# Patient Record
Sex: Female | Born: 1945 | Race: Black or African American | Hispanic: No | Smoking: Never smoker
Health system: Southern US, Community
[De-identification: ages and names within clinical notes are randomized; demographics above are authoritative.]

## PROBLEM LIST (undated history)

## (undated) DIAGNOSIS — F329 Major depressive disorder, single episode, unspecified: Secondary | ICD-10-CM

## (undated) DIAGNOSIS — F419 Anxiety disorder, unspecified: Secondary | ICD-10-CM

## (undated) DIAGNOSIS — K921 Melena: Secondary | ICD-10-CM

## (undated) DIAGNOSIS — M1990 Unspecified osteoarthritis, unspecified site: Secondary | ICD-10-CM

## (undated) DIAGNOSIS — N390 Urinary tract infection, site not specified: Secondary | ICD-10-CM

## (undated) DIAGNOSIS — K219 Gastro-esophageal reflux disease without esophagitis: Secondary | ICD-10-CM

## (undated) DIAGNOSIS — F32A Depression, unspecified: Secondary | ICD-10-CM

## (undated) DIAGNOSIS — E079 Disorder of thyroid, unspecified: Secondary | ICD-10-CM

## (undated) DIAGNOSIS — I10 Essential (primary) hypertension: Secondary | ICD-10-CM

## (undated) DIAGNOSIS — G4730 Sleep apnea, unspecified: Secondary | ICD-10-CM

## (undated) HISTORY — DX: Urinary tract infection, site not specified: N39.0

## (undated) HISTORY — DX: Depression, unspecified: F32.A

## (undated) HISTORY — PX: HERNIA REPAIR: SHX51

## (undated) HISTORY — DX: Major depressive disorder, single episode, unspecified: F32.9

## (undated) HISTORY — DX: Gastro-esophageal reflux disease without esophagitis: K21.9

## (undated) HISTORY — DX: Melena: K92.1

## (undated) HISTORY — DX: Unspecified osteoarthritis, unspecified site: M19.90

## (undated) HISTORY — DX: Sleep apnea, unspecified: G47.30

## (undated) HISTORY — DX: Essential (primary) hypertension: I10

## (undated) HISTORY — DX: Anxiety disorder, unspecified: F41.9

## (undated) HISTORY — DX: Disorder of thyroid, unspecified: E07.9

---

## 1998-08-21 ENCOUNTER — Ambulatory Visit (HOSPITAL_COMMUNITY): Admission: RE | Admit: 1998-08-21 | Discharge: 1998-08-21 | Payer: Self-pay | Admitting: General Surgery

## 1998-09-03 ENCOUNTER — Ambulatory Visit (HOSPITAL_COMMUNITY): Admission: RE | Admit: 1998-09-03 | Discharge: 1998-09-03 | Payer: Self-pay | Admitting: General Surgery

## 2005-01-17 ENCOUNTER — Emergency Department (HOSPITAL_COMMUNITY): Admission: EM | Admit: 2005-01-17 | Discharge: 2005-01-17 | Payer: Self-pay | Admitting: Family Medicine

## 2005-07-04 ENCOUNTER — Ambulatory Visit: Payer: Self-pay | Admitting: Sports Medicine

## 2005-07-04 ENCOUNTER — Inpatient Hospital Stay (HOSPITAL_COMMUNITY): Admission: EM | Admit: 2005-07-04 | Discharge: 2005-07-06 | Payer: Self-pay | Admitting: Emergency Medicine

## 2005-07-05 ENCOUNTER — Encounter (INDEPENDENT_AMBULATORY_CARE_PROVIDER_SITE_OTHER): Payer: Self-pay | Admitting: Cardiology

## 2005-09-17 ENCOUNTER — Ambulatory Visit: Payer: Self-pay | Admitting: Family Medicine

## 2006-05-31 ENCOUNTER — Ambulatory Visit: Payer: Self-pay | Admitting: Sports Medicine

## 2007-02-16 DIAGNOSIS — K5900 Constipation, unspecified: Secondary | ICD-10-CM

## 2007-02-16 DIAGNOSIS — M109 Gout, unspecified: Secondary | ICD-10-CM

## 2007-02-16 DIAGNOSIS — K449 Diaphragmatic hernia without obstruction or gangrene: Secondary | ICD-10-CM | POA: Insufficient documentation

## 2008-05-23 ENCOUNTER — Ambulatory Visit (HOSPITAL_COMMUNITY): Admission: RE | Admit: 2008-05-23 | Discharge: 2008-05-23 | Payer: Self-pay | Admitting: Internal Medicine

## 2009-12-14 ENCOUNTER — Emergency Department (HOSPITAL_COMMUNITY): Admission: EM | Admit: 2009-12-14 | Discharge: 2009-12-14 | Payer: Self-pay | Admitting: Emergency Medicine

## 2010-03-23 ENCOUNTER — Encounter (INDEPENDENT_AMBULATORY_CARE_PROVIDER_SITE_OTHER): Payer: Self-pay | Admitting: *Deleted

## 2011-01-10 ENCOUNTER — Encounter: Payer: Self-pay | Admitting: Family Medicine

## 2011-01-19 ENCOUNTER — Emergency Department (HOSPITAL_COMMUNITY)
Admission: EM | Admit: 2011-01-19 | Discharge: 2011-01-19 | Payer: Self-pay | Source: Home / Self Care | Admitting: Emergency Medicine

## 2011-01-19 LAB — DIFFERENTIAL
Basophils Absolute: 0 10*3/uL (ref 0.0–0.1)
Basophils Relative: 0 % (ref 0–1)
Eosinophils Absolute: 0.1 10*3/uL (ref 0.0–0.7)
Eosinophils Relative: 1 % (ref 0–5)
Lymphocytes Relative: 14 % (ref 12–46)
Lymphs Abs: 1.5 K/uL (ref 0.7–4.0)
Monocytes Absolute: 0.7 10*3/uL (ref 0.1–1.0)
Monocytes Relative: 6 % (ref 3–12)
Neutro Abs: 8.3 10*3/uL — ABNORMAL HIGH (ref 1.7–7.7)
Neutrophils Relative %: 79 % — ABNORMAL HIGH (ref 43–77)

## 2011-01-19 LAB — CBC
HCT: 41.8 % (ref 36.0–46.0)
Hemoglobin: 13.8 g/dL (ref 12.0–15.0)
MCH: 27.7 pg (ref 26.0–34.0)
MCHC: 33 g/dL (ref 30.0–36.0)
MCV: 83.8 fL (ref 78.0–100.0)
Platelets: 350 10*3/uL (ref 150–400)
RBC: 4.99 MIL/uL (ref 3.87–5.11)
RDW: 15.1 % (ref 11.5–15.5)
WBC: 10.5 K/uL (ref 4.0–10.5)

## 2011-01-19 LAB — BASIC METABOLIC PANEL
BUN: 8 mg/dL (ref 6–23)
CO2: 29 mEq/L (ref 19–32)
Calcium: 9.5 mg/dL (ref 8.4–10.5)
Creatinine, Ser: 0.95 mg/dL (ref 0.4–1.2)
GFR calc Af Amer: >60 mL/min (ref >60)
Glucose, Bld: 96 mg/dL (ref 70–99)

## 2011-01-19 LAB — BRAIN NATRIURETIC PEPTIDE: Pro B Natriuretic peptide (BNP): <30 pg/mL (ref 0.0–100.0)

## 2011-01-19 LAB — BASIC METABOLIC PANEL WITH GFR
Chloride: 104 meq/L (ref 96–112)
GFR calc non Af Amer: 60 mL/min — ABNORMAL LOW (ref >60)
Potassium: 3.8 meq/L (ref 3.5–5.1)
Sodium: 143 meq/L (ref 135–145)

## 2011-01-19 LAB — POCT CARDIAC MARKERS: Myoglobin, poc: 114 ng/mL (ref 12–200)

## 2011-01-19 NOTE — Letter (Signed)
Summary: New Patient letter  [facility]  [address] [place], [place] [address]   Phone: [phone]  Fax: [phone]       03/23/2010 MRN: [id number]  [facility] [address] [place], [place]  [address]  Dear Ms. Thelma Barge,  Welcome to the Gastroenterology Division at [facility].    You are scheduled to see Dr.  Jarold Motto on Apr 23, 2010 at 10am on the 3rd floor at [facility], [address].  We ask that you try to arrive at our office 15 minutes prior to your appointment time to allow for check-in.  We would like you to complete the enclosed self-administered evaluation form prior to your visit and bring it with you on the day of your appointment.  We will review it with you.  Also, please bring a complete list of all your medications or, if you prefer, bring the medication bottles and we will list them.  Please bring your insurance card so that we may make a copy of it.  If your insurance requires a referral to see a specialist, please bring your referral form from your primary care physician.  Co-payments are due at the time of your visit and may be paid by cash, check or credit card.     Your office visit will consist of a consult with your physician (includes a physical exam), any laboratory testing he/she may order, scheduling of any necessary diagnostic testing (e.g. x-ray, ultrasound, CT-scan), and scheduling of a procedure (e.g. Endoscopy, Colonoscopy) if required.  Please allow enough time on your schedule to allow for any/all of these possibilities.    If you cannot keep your appointment, please call [phone] to cancel or reschedule prior to your appointment date.  This allows [place] the opportunity to schedule an appointment for another patient in need of care.  If you do not cancel or reschedule by 5 p.m. the business day prior to your appointment date, you will be charged a $50.00 late cancellation/no-show fee.    Thank you for  choosing [facility] for your medical needs.  We appreciate the opportunity to care for you.  Please visit [place] at our website  to learn more about our practice.                     Sincerely,                                                             The Gastroenterology Division

## 2011-03-22 LAB — POCT URINALYSIS DIP (DEVICE)
Bilirubin Urine: NEGATIVE
Glucose, UA: NEGATIVE mg/dL
Hgb urine dipstick: NEGATIVE
Ketones, ur: NEGATIVE mg/dL
Nitrite: NEGATIVE
Protein, ur: 30 mg/dL — AB
Specific Gravity, Urine: 1.01 (ref 1.005–1.030)
Urobilinogen, UA: 0.2 mg/dL (ref 0.0–1.0)
pH: 6 (ref 5.0–8.0)

## 2011-05-07 NOTE — H&P (Signed)
NAME:  Frances [name], Frances [name]              ACCOUNT NO.:  [id number]   MEDICAL RECORD NO.:  [id number]          PATIENT TYPE:  INP   LOCATION:  5524                         FACILITY:  [facility]   PHYSICIAN:  Wayne A. Sheffield Slider, M.D.    DATE OF BIRTH:  October 11, 1946   DATE OF ADMISSION:  07/04/2005  DATE OF DISCHARGE:                                HISTORY & PHYSICAL   CHIEF COMPLAINT:  Abdominal pain/arrhythmia.   HISTORY OF PRESENT ILLNESS:  This is a 65 year old obese African American  female with a one-week history of abdominal pain that increased this morning  causing her to go to her primary care physician at [facility].  While there,  they noticed an irregular heartbeat on physical examination and did an EKG  that showed bigeminy.  Her abdominal pain was a dully, achy pain on the left  side, just underneath the rib cage.  She had nausea but no vomiting. She has  had diarrhea for about one week.  She has had some abdominal pain for the  last week every since she started increasing her gout medications for acute  gout flare up.  Her medications include Indocin and colchicine.  She self-  discontinued these medications about three days ago.  She denies any chest  pain, any syncope or presyncopal events recently and any short of breath at  rest.  She has no diagnosis of heart failure or history of MI, no previous  echocardiogram.  She does have a history of hypertension.  She is able to  walk up a flight of stairs, though she does become short of breath, but she  feels it is secondary to her deconditioning and obesity.   REVIEW OF SYSTEMS:  NEUROLOGIC:  No weakness or numbness.  HEENT:  The  patient has a one to two day history of increasing sinus discharge and sore  throat.  She denies cough.  She denies any tooth pain.  PULMONARY:  Within  normal limits.  CARDIOVASCULAR:  See HPI.  GI:  The patient states that  about a year ago she had some bright red blood per rectum and was told by  her physician  that she had some external hemorrhoids and a rectal fissure.  It was recommended that she get a colonoscopy at that time, which she  decided not to follow through with.  She also has a history of occasional  difficulty swallowing with solids and they were going to do an upper  endoscopy at the same time, but like I said, she did not follow through with  her appointment.  GU:  No burning or pain during urination.  MUSCULOSKELETAL:  She has considerable arthritis and recent acute gout flare  up.   MEDICATIONS:  1.  Indomethacin discontinued two days ago.  2.  Synthroid 50 mcg daily.  3.  Colchicine 0.6 mg twice a day discontinued two days ago.  4.  Triamterene 37.5 mg/hydrochlorothiazide 25 mg.  5.  She received aspirin 324 mg in the emergency department.   PAST MEDICAL HISTORY:  1.  Gout.  2.  Hypothyroidism.  3.  Hypertension.  4.  History of umbilical hernia repair in 1999.  5.  History of palpations, no hospitalizations.   SOCIAL HISTORY:  She lives with her daughter.  Denies tobacco, alcohol or  other drug use but does say that she uses a lot of caffeine.  She has  increased stress in her life secondary to her daughter's recent move.   FAMILY HISTORY:  Father died at age 69 of a CVA and had hypertension.  No  diabetes in the family.  Grandmother also died of a stroke.  Mother is still  alive and only has arthritis.  Siblings are all alive and well.   PHYSICAL EXAMINATION:  GENERAL APPEARANCE:  No acute distress. Obese African  American female.  PSYCHIATRIC:  Patient has good mood, affect is normal, alert and oriented  x3.  VITAL SIGNS:  Temperature 97.1, pulse 79 to 80, respiratory rate 18 to 22,  blood pressure 114 to 138/29 to 70.  Her blood pressure is rechecked after  that small diastolic blood pressure and it was 135/38.  HEENT:  Moist mucous membranes.  Increased nasal discharge, light yellow.  Tympanic membranes clear.  No thyromegaly.  LYMPHATICS:  There are no  palpable lymph nodes in the cervical, clavicular  and axillary regions.  LUNGS:  Clear to auscultation bilaterally.  CARDIOVASCULAR:  Irregular rhythm but regular rate.  No murmurs, rubs, or  gallops.  ABDOMEN:  Soft, nontender, normal active bowel sounds.  No  hepatosplenomegaly, although the exam was limited by patient's obesity.  BACK:  No CVA tenderness.  RECTAL:  No masses.  Soft stool palpated.  External hemorrhoid noted.  No  active bleeding.  EXTREMITIES:  No edema or cyanosis.  SKIN:  No rashes.  MUSCULOSKELETAL:  Good range of motion and strength 5/5 throughout.  NEUROLOGIC:  Cranial nerves II-XII  intact.  Gross motor and sensation  intact.  DTRs 2+ throughout.   LABORATORY DATA:  Fecal occult blood negative.  White blood cells 11.7,  hemoglobin 13.4, hematocrit 40.3, platelets 426, ANC 9.8.  UA normal.  Lipase 21, total protein 7.2, albumin 3.5, AST 26, ALT 27, alkaline  phosphatase 80, total bilirubin 0.6, direct bilirubin 0.1, indirect 0.5.  ISTAT-8:  Sodium 141, potassium 3.6, chloride 104, bicarb 31.9, BUN 14,  creatinine 0.9, glucose 96.  Myoglobin 119, CK-MB less than 1.0, troponin I  less than 0.05.  BNP less than 30.  Cardiac enzymes negative x3.  Tox-screen  was negative.   EKG showing multiple PVCs/bigeminy.   Abdominal x-ray and chest showed no acute cardiopulmonary processes and  nonspecific bowel gas pattern.   ASSESSMENT/PLAN:  A 65 year old [place] American female.  1.  PVCs.  We will continue to monitor on telemetry.  We will rule out      myocardial infarction as PVCs carry a higher morbidity and mortality      rate with an acute myocardial infarction.  Early cardiac enzymes were      negative and there were no signs of ischemia on EKG; however, we will      continue to monitor and recheck enzymes.  We will also order an echo in      the morning to rule out mitral valve prolapse which also has a     significant morbidity and mortality when accompanied  with PVCs.  We will      consider a cardiology consult in the morning and will get a repeat EKG      in the morning.  2.  Abdominal pain/diarrhea.  This has currently resolved and her      electrolytes look okay.  She has been eating well.  Her lipase is      normal.  As mentioned above, her abdominal films were normal and her      fecal occult blood was negative.  We will start Protonix 40 mg daily      anyway.  3.  Hypothyroidism.  Will continue 50 mcg Synthroid, will check TSH.  4.  Gout.  We are going to hold Indomethacin and colchicine.  Will also hold      hydrochlorothiazide as this medication is known to help precipitate      gout.  We will write for Tylenol p.r.n. for pain.  5.  Hypertension.  For now, we will hold both her home medications,      triamterene  37.5 and hydrochlorothiazide 25, because both have adverse      effects on the potassium.  6.  Borderline hypokalemia.  Although her potassium is within normal range      on the ISTAT-8, she is at risk for hypokalemia and we will replace with      40 mEq K-Dur right now.  We will check a more accurate traditional BMP      in follow-up.  7.  Upper respiratory tract infection.  This is most likely viral.  We will      watch.  8.  Fluids, electrolytes, and nutrition. Patient is eating as stated above.      We are repleting borderline potassium.  The patient has an IV and we      will Hep-Lock it for now.  9.  Disposition:  The patient will most likely be discharged tomorrow or the      next day depending upon how long it takes to get results of echo and      other tests.      Clifton Custard   AL/MEDQ  D:  07/04/2005  T:  07/04/2005  Job:  [id number]   cc:   Carleene Cooper III, M.D.  [address]  [place]  [place] [address]  Fax: [phone]

## 2011-05-07 NOTE — Discharge Summary (Signed)
NAME:  Frances [name], Frances [name]              ACCOUNT NO.:  [id number]   MEDICAL RECORD NO.:  [id number]          PATIENT TYPE:  INP   LOCATION:  5532                         FACILITY:  [facility]   PHYSICIAN:  Melina Fiddler, MD DATE OF BIRTH:  09-24-1946   DATE OF ADMISSION:  07/04/2005  DATE OF DISCHARGE:  07/06/2005                                 DISCHARGE SUMMARY   PREOPERATIVE DIAGNOSES:  1.  Premature ventricular contractions/bigeminy.  2.  Hypertension.  3.  Gout.  4.  Hypothyroidism.   PROCEDURES:  1.  Echocardiogram on July 05, 2005, showed left ventricular hypertrophy,      some mild mitral regurgitation, but a normal ejection fraction between      50 and 60%.  2.  EKGs on July 04, 2005, and July 05, 2005, showing normal sinus rhythm      with frequent PVCs/bigeminy.  3.  Abdomen two-view and chest that showed no acute cardiopulmonary      processes and no acute abdominal processes, nonspecific bowel gas      pattern.   DISCHARGE MEDICATIONS:  1.  Toprol XL 50 mg.  2.  Protonix 40 mg daily.  3.  Synthroid 50 mcg daily.   HOSPITAL COURSE:  The patient was admitted on July 04, 2005, after being  transferred from her urgent care facility.  She had had abdominal complaints  including diarrhea and abdominal pain for the last week or so, which  worsened in the day before admission.  In the urgent care center, the  patient was found to have bigeminy and was transferred to the hospital for a  workup.  In the hospital, she was ruled out for MI with three normal cardiac  enzyme sets.  We checked her potassium, which was on the low side of normal,  so she was given a total of 80 mEq of K-Dur, and she got an echocardiogram  that showed no mitral prolapse, left ventricular hypertrophy, some mild  mitral regurgitation, and a normal ejection fraction.  Cardiology was  consulted and they decided the patient most likely had obstructive sleep  apnea, but they scheduled an outpatient stress  test to be sure she had no  coronary artery disease.  We checked her uric acid, which was 10, and her  TSH, which was within the normal range.  Since the patient was on a  combination drug with HCTZ on admission, triamterene/HCTZ, we discontinued  that medication as it has a tendency to worsen gout.   Admission labs:  Total bilirubin 0.6, direct bilirubin 0.1, indirect 0.5,  alkaline phosphatase 80, AST 26, ALT 27, total protein 7.2, albumin 3.5.  Cardiac enzymes negative x3.  UA was normal.  Urine drug screen was  negative.  Fecal occult blood was negative.  Sodium 141, potassium 3.6,  chloride 104, glucose 96, BUN 14, bicarb 31.9.  Hemoglobin 15.0, hematocrit  44.0.   Problem 1.  PVC/BIGEMINY:  Since we ruled the patient out for a mitral valve  prolapse, hypokalemia and acute MI, it was determined that the PVCs were  most likely not a danger to the  patient's health, which was confirmed by  cardiology.  Cardiology also decided they wanted to do a test for a sleep  study and a stress test.  I have arranged for both of those to be done in  the outpatient setting.  We switched her hypertension medication from the  triamterene/HCTZ to Toprol XL.  Most likely the patient's PVCs should not  have an impact on her life.  At the time of discharge, the frequency of the  PVCs had decreased significantly from about one every six to nine to  extremely infrequently.   Problem 2.  HYPERTENSION:  As stated in #1, the patient was switched from  triamterine/HCTZ over to Toprol XL 50 mg daily at night.   Problem 3.  HYPOTHYROID:  The patient's TSH was normal, so she was continued  on Synthroid 50 mcg daily.   Problem 4.  GOUT:  The patient was advised to continue her home medications  during an acute flare-up although the patient was currently not having a  symptomatic flare.  These include colchicine.  She was also recommended to  switch from indomethacin as a supplement to the colchicine to a more  mild  NSAID like Naprosyn or Aleve.   Problem 5.  GASTROESOPHAGEAL REFLUX DISEASE:  The patient was started on  Protonix 40 mg daily because of her abdominal pain on admission.  It was  determined that there is a good chance that this is related to  gastroesophageal reflux disease, and the patient will continue on Protonix  40 mg daily to prevent further intense abdominal pain episodes.   DISCHARGE LABORATORY DATA:  Sodium 143, potassium 3.9, chloride 106, CO2 29,  glucose 104, BUN 12, creatinine 1.2, calcium 9.4.  Cholesterol 156,  triglycerides 131, HDL 34, LDL 96, VLDL 26.  TSH 1.659.  Uric acid 10.0.   FOLLOW-UP ITEMS:  1.  PVCs/bigeminy:  The patient has an appointment for follow-up with Dr.      Jenne Campus on August 4 at 2:45 p.m.  She also has an appointment for a      stress test tomorrow morning on July 07, 2005.  Cardiology has stated      that they will schedule her for a sleep study as well.  2.  Hypertension:  The patient will follow up with primary care for      hypertension.  3.  Hypothyroid and gout:  The patient will also follow those up with her      primary care physician, Dr. Irving Burton on August 06, 2005, at 1:30 p.m.      Clifton Custard   AL/MEDQ  D:  07/06/2005  T:  07/07/2005  Job:  [id number]   cc:   Angeline Slim, M.D.  Fax: [phone]   Darlin Priestly, MD  [phone] [address]  [place]  [place] [address]  Fax: [phone]

## 2011-05-10 ENCOUNTER — Other Ambulatory Visit: Payer: Self-pay | Admitting: Otolaryngology

## 2011-05-10 DIAGNOSIS — R42 Dizziness and giddiness: Secondary | ICD-10-CM

## 2011-05-14 ENCOUNTER — Ambulatory Visit
Admission: RE | Admit: 2011-05-14 | Discharge: 2011-05-14 | Disposition: A | Discharge status: Home / Self Care | Payer: BC Managed Care – PPO | Source: Ambulatory Visit | Attending: Otolaryngology | Admitting: Otolaryngology

## 2011-05-14 DIAGNOSIS — R42 Dizziness and giddiness: Secondary | ICD-10-CM

## 2013-05-02 ENCOUNTER — Other Ambulatory Visit: Payer: Self-pay | Admitting: Gastroenterology

## 2013-05-02 DIAGNOSIS — R1013 Epigastric pain: Secondary | ICD-10-CM

## 2013-05-07 ENCOUNTER — Other Ambulatory Visit: Payer: BC Managed Care – PPO

## 2015-04-22 ENCOUNTER — Telehealth: Payer: Self-pay | Admitting: Internal Medicine

## 2015-04-22 NOTE — Telephone Encounter (Signed)
Patient is requesting to establish care with Dr. Jones.  Please advise.   °

## 2015-04-23 NOTE — Telephone Encounter (Signed)
Got scheduled  °

## 2015-04-23 NOTE — Telephone Encounter (Signed)
ok 

## 2015-05-08 ENCOUNTER — Ambulatory Visit: Payer: Self-pay | Admitting: Internal Medicine

## 2015-05-12 ENCOUNTER — Ambulatory Visit: Payer: Self-pay | Admitting: Internal Medicine

## 2015-05-15 ENCOUNTER — Ambulatory Visit: Payer: Self-pay | Admitting: Internal Medicine

## 2015-05-30 ENCOUNTER — Ambulatory Visit: Payer: Self-pay | Admitting: Internal Medicine

## 2015-07-01 ENCOUNTER — Ambulatory Visit (INDEPENDENT_AMBULATORY_CARE_PROVIDER_SITE_OTHER): Payer: Medicare Other | Admitting: Internal Medicine

## 2015-07-01 ENCOUNTER — Encounter: Payer: Self-pay | Admitting: Internal Medicine

## 2015-07-01 VITALS — BP 140/88 | HR 74 | Temp 98.5°F | Resp 16 | Ht 63.0 in | Wt 236.1 lb

## 2015-07-01 DIAGNOSIS — H15109 Unspecified episcleritis, unspecified eye: Secondary | ICD-10-CM | POA: Insufficient documentation

## 2015-07-01 DIAGNOSIS — E559 Vitamin D deficiency, unspecified: Secondary | ICD-10-CM | POA: Insufficient documentation

## 2015-07-01 DIAGNOSIS — I10 Essential (primary) hypertension: Secondary | ICD-10-CM

## 2015-07-01 DIAGNOSIS — E039 Hypothyroidism, unspecified: Secondary | ICD-10-CM | POA: Diagnosis not present

## 2015-07-01 DIAGNOSIS — M109 Gout, unspecified: Secondary | ICD-10-CM | POA: Insufficient documentation

## 2015-07-01 DIAGNOSIS — K449 Diaphragmatic hernia without obstruction or gangrene: Secondary | ICD-10-CM | POA: Insufficient documentation

## 2015-07-01 NOTE — Assessment & Plan Note (Signed)
Recently checked levels and will get those records. Continue synthroid 50 mcg daily for now. No symptoms of over or under replacement.

## 2015-07-01 NOTE — Progress Notes (Signed)
Pre visit review using our clinic review tool, if applicable. No additional management support is needed unless otherwise documented below in the visit note. 

## 2015-07-01 NOTE — Progress Notes (Signed)
   Subjective:    Patient ID: Frances [name], female    DOB: January 10, 1946, 69 y.o.   MRN: [id number]  HPI The patient is a 69 YO female coming in new with high blood pressure. She has been on the same blood pressure medicines for some time. Doing well with her pressures. No side effects and no known complications. She has been struggling with this for many years. No new complaints at this time.   [facility], social history reviewed and updated.   Review of Systems  Constitutional: Negative for fever, activity change, appetite change, fatigue and unexpected weight change.  HENT: Negative.   Respiratory: Negative for cough, chest tightness, shortness of breath and wheezing.   Cardiovascular: Negative for chest pain, palpitations and leg swelling.  Gastrointestinal: Negative for nausea, abdominal pain, diarrhea, constipation and abdominal distention.  Musculoskeletal: Negative.   Skin: Negative.   Neurological: Negative.   Psychiatric/Behavioral: Negative.      Objective:   Physical Exam  Constitutional: She is oriented to person, place, and time. She appears well-developed and well-nourished.  HENT:  Head: Normocephalic and atraumatic.  Eyes: EOM are normal.  Neck: Normal range of motion. No thyromegaly present.  Cardiovascular: Normal rate and regular rhythm.   Pulmonary/Chest: Effort normal and breath sounds normal. No respiratory distress. She has no wheezes. She has no rales.  Abdominal: Soft. She exhibits no distension. There is no tenderness. There is no rebound.  Musculoskeletal: She exhibits no edema.  Lymphadenopathy:    She has no cervical adenopathy.  Neurological: She is alert and oriented to person, place, and time. Coordination normal.  Skin: Skin is warm and dry.  Psychiatric: She has a normal mood and affect.   Filed Vitals:   07/01/15 1112  BP: 140/88  Pulse: 74  Temp: 98.5 F (36.9 C)  TempSrc: Oral  Resp: 16  Height: 5\' 3"  (1.6 m)  Weight: 236 lb 1.9 oz  (107.103 kg)  SpO2: 97%      Assessment & Plan:

## 2015-07-01 NOTE — Assessment & Plan Note (Signed)
Continue amlodipine 5 mg daily, toprol-xl 100 mg daily, lisinopril 40 mg daily. She states labs done 1 month ago and will retrieve and review records.

## 2015-07-01 NOTE — Patient Instructions (Signed)
We can recommend that you take alleve (naproxen) 1 pill twice a day for 1 week to see if it helps your neck.   If you are still having the problem we can consider sending you to the ear, nose, and throat doctor if it gets worse.   Sometimes after you have a cold some of the viruses are filtered out through lymph nodes in the neck which can get sore. I did not feel anything on exam to suggest anything bad.  We are not changing the medicines today and will get your records from the other office so we don't have to repeat anything.   Exercise to Stay Healthy Exercise helps you become and stay healthy. EXERCISE IDEAS AND TIPS Choose exercises that:  You enjoy.  Fit into your day. You do not need to exercise really hard to be healthy. You can do exercises at a slow or medium level and stay healthy. You can:  Stretch before and after working out.  Try yoga, Pilates, or tai chi.  Lift weights.  Walk fast, swim, jog, run, climb stairs, bicycle, dance, or rollerskate.  Take aerobic classes. Exercises that burn about 150 calories:  Running 1  miles in 15 minutes.  Playing volleyball for 45 to 60 minutes.  Washing and waxing a car for 45 to 60 minutes.  Playing touch football for 45 minutes.  Walking 1  miles in 35 minutes.  Pushing a stroller 1  miles in 30 minutes.  Playing basketball for 30 minutes.  Raking leaves for 30 minutes.  Bicycling 5 miles in 30 minutes.  Walking 2 miles in 30 minutes.  Dancing for 30 minutes.  Shoveling snow for 15 minutes.  Swimming laps for 20 minutes.  Walking up stairs for 15 minutes.  Bicycling 4 miles in 15 minutes.  Gardening for 30 to 45 minutes.  Jumping rope for 15 minutes.  Washing windows or floors for 45 to 60 minutes. Document Released: 01/08/2011 Document Revised: 02/28/2012 Document Reviewed: 01/08/2011 [facility] Patient Information 2015 [place], [place]. This information is not intended to replace advice given to  you by your health care provider. Make sure you discuss any questions you have with your health care provider.

## 2015-07-15 ENCOUNTER — Ambulatory Visit: Payer: Self-pay | Admitting: Internal Medicine

## 2015-09-09 ENCOUNTER — Ambulatory Visit: Payer: Medicare Other | Admitting: Internal Medicine

## 2015-09-15 ENCOUNTER — Ambulatory Visit (INDEPENDENT_AMBULATORY_CARE_PROVIDER_SITE_OTHER): Payer: Medicare Other | Admitting: Internal Medicine

## 2015-09-15 ENCOUNTER — Encounter: Payer: Self-pay | Admitting: Internal Medicine

## 2015-09-15 VITALS — BP 158/80 | HR 74 | Temp 98.5°F | Resp 14 | Ht 63.0 in | Wt 237.0 lb

## 2015-09-15 DIAGNOSIS — H04123 Dry eye syndrome of bilateral lacrimal glands: Secondary | ICD-10-CM

## 2015-09-15 DIAGNOSIS — K089 Disorder of teeth and supporting structures, unspecified: Secondary | ICD-10-CM

## 2015-09-15 MED ORDER — OLOPATADINE HCL 0.2 % OP SOLN: OPHTHALMIC | Status: DC

## 2015-09-15 MED ORDER — LIDOCAINE VISCOUS 2 % MT SOLN: 20.0000 mL | OROMUCOSAL | Status: DC | PRN

## 2015-09-15 NOTE — Progress Notes (Signed)
Pre visit review using our clinic review tool, if applicable. No additional management support is needed unless otherwise documented below in the visit note. 

## 2015-09-15 NOTE — Patient Instructions (Addendum)
We have sent in the eye drops and we will do a letter to the insurance about the medical necessity of the dental work.   We have sent in the lidocaine liquid for the mouth and the eye drops.   With the lidocaine you can swish and spit it out. Hold it in your mouth about 30 seconds to 1 minute to help with the pain. You can use it when you need it up to 4 times a day.

## 2015-09-16 DIAGNOSIS — H04123 Dry eye syndrome of bilateral lacrimal glands: Secondary | ICD-10-CM | POA: Insufficient documentation

## 2015-09-16 DIAGNOSIS — K089 Disorder of teeth and supporting structures, unspecified: Secondary | ICD-10-CM | POA: Insufficient documentation

## 2015-09-16 NOTE — Assessment & Plan Note (Addendum)
Feel that some of her dental disease is a result of her medical therapy and it is a medical necessity to get her dental work done to help with chewing and being able to stay infection free. Lidocaine viscous for pain to avoid some increased OTC meds (she is taking more ibuprofen and causing stomach upset).

## 2015-09-16 NOTE — Assessment & Plan Note (Signed)
oloptadine eye drops for her eyes as well as using OTC drops for moisture.

## 2015-09-16 NOTE — Progress Notes (Signed)
   Subjective:    Patient ID: Frances [name], female    DOB: 1946-05-26, 69 y.o.   MRN: [id number]  HPI The patient is a 69 YO female coming in with a couple acute concerns. Her main concern is the poor state of her gums and teeth. Due to her medications over the years she has had dry mouth. This has caused her teeth to decay. She has not too many teeth left and since retiring has not had dental insurance so has not been able to have routine maintenance.  Her other concern is her chronic dry eyes. She had been recommended restasis but was not able to afford. She uses over the counter drops but they are not that effective.   Review of Systems  Constitutional: Negative for fever, activity change, appetite change, fatigue and unexpected weight change.  HENT: Negative.   Respiratory: Negative for cough, chest tightness, shortness of breath and wheezing.   Cardiovascular: Negative for chest pain, palpitations and leg swelling.  Gastrointestinal: Negative for nausea, abdominal pain, diarrhea, constipation and abdominal distention.  Musculoskeletal: Negative.   Skin: Negative.   Neurological: Negative.   Psychiatric/Behavioral: Negative.       Objective:   Physical Exam  Constitutional: She is oriented to person, place, and time. She appears well-developed and well-nourished.  HENT:  Head: Normocephalic and atraumatic.  Mouth without active gum disease, few teeth left and those that are left with some blackness at the root. No active abscesses.   Eyes: EOM are normal. Pupils are equal, round, and reactive to light.  Some redness right eye  Neck: Normal range of motion. No thyromegaly present.  Cardiovascular: Normal rate and regular rhythm.   Pulmonary/Chest: Effort normal and breath sounds normal. No respiratory distress. She has no wheezes. She has no rales.  Abdominal: Soft. She exhibits no distension. There is no tenderness. There is no rebound.  Musculoskeletal: She exhibits no edema.   Lymphadenopathy:    She has no cervical adenopathy.  Neurological: She is alert and oriented to person, place, and time. Coordination normal.  Skin: Skin is warm and dry.  Psychiatric: She has a normal mood and affect.   Filed Vitals:   09/15/15 1617  BP: 158/80  Pulse: 74  Temp: 98.5 F (36.9 C)  TempSrc: Oral  Resp: 14  Height:  (1.6 m)  Weight: 237 lb (107.502 kg)  SpO2: 98%      Assessment & Plan:

## 2015-09-18 ENCOUNTER — Telehealth: Payer: Self-pay | Admitting: Internal Medicine

## 2015-09-18 ENCOUNTER — Other Ambulatory Visit: Payer: Self-pay | Admitting: Internal Medicine

## 2015-09-18 NOTE — Telephone Encounter (Signed)
Pt called regarding the prescriptions amLODipine (NORVASC) 5 MG tablet [[id number] and  levothyroxine (SYNTHROID, LEVOTHROID) 50 MCG tablet [[id number] and lisinopril (PRINIVIL,ZESTRIL) 40 MG tablet [[id number]]   She needs 90 day refills on all these to [facility] on [address]  I see the amlodipine was already done but she needs 90 day

## 2015-09-19 ENCOUNTER — Other Ambulatory Visit: Payer: Self-pay | Admitting: Geriatric Medicine

## 2015-09-19 MED ORDER — AMLODIPINE BESYLATE 5 MG PO TABS: 5.0000 mg | ORAL_TABLET | Freq: Every day | ORAL | Status: DC

## 2015-09-19 MED ORDER — LISINOPRIL 40 MG PO TABS
ORAL_TABLET | ORAL | Status: DC
Start: 2015-09-19 — End: 2016-08-25

## 2015-09-19 MED ORDER — LEVOTHYROXINE SODIUM 50 MCG PO TABS: ORAL_TABLET | ORAL | Status: DC

## 2015-09-19 NOTE — Telephone Encounter (Signed)
Sent to pharmacy 

## 2015-12-02 ENCOUNTER — Ambulatory Visit: Payer: Medicare Other | Admitting: Family

## 2015-12-10 ENCOUNTER — Ambulatory Visit: Payer: Medicare Other | Admitting: Internal Medicine

## 2015-12-17 ENCOUNTER — Ambulatory Visit (INDEPENDENT_AMBULATORY_CARE_PROVIDER_SITE_OTHER): Payer: Medicare Other | Admitting: Internal Medicine

## 2015-12-17 ENCOUNTER — Encounter: Payer: Self-pay | Admitting: Internal Medicine

## 2015-12-17 VITALS — BP 160/82 | HR 76 | Temp 98.3°F | Resp 16 | Ht 63.0 in | Wt 244.0 lb

## 2015-12-17 DIAGNOSIS — I10 Essential (primary) hypertension: Secondary | ICD-10-CM | POA: Diagnosis not present

## 2015-12-17 DIAGNOSIS — I872 Venous insufficiency (chronic) (peripheral): Secondary | ICD-10-CM | POA: Diagnosis not present

## 2015-12-17 DIAGNOSIS — Z23 Encounter for immunization: Secondary | ICD-10-CM | POA: Diagnosis not present

## 2015-12-17 DIAGNOSIS — E039 Hypothyroidism, unspecified: Secondary | ICD-10-CM

## 2015-12-17 MED ORDER — FUROSEMIDE 20 MG PO TABS: 20.0000 mg | ORAL_TABLET | Freq: Every day | ORAL | Status: DC

## 2015-12-17 NOTE — Assessment & Plan Note (Signed)
Given rx for compression stockings. Will start lasix for fluid for several weeks then check labs. With the compression stockings she may not need the lasix.

## 2015-12-17 NOTE — Addendum Note (Signed)
Addended by: Conception ChancyOBERSON, Jencarlo Bonadonna R on: 12/17/2015 03:58 PM   Modules accepted: Orders

## 2015-12-17 NOTE — Assessment & Plan Note (Signed)
BP mildly elevated again (was at last visit). Will add lasix for concurrent treatment of her BP and leg edema. 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 Continue lisinopril, metoprolol, amlodipine. Check BMP in 2-3 weeks.

## 2015-12-17 NOTE — Progress Notes (Signed)
   Subjective:    Patient ID: Frances [name], female    DOB: [birth year], 69 y.o.   MRN: [id number]  HPI The patient is a 69 YO female coming in for leg and ankle swelling. Going on for several weeks and mostly at the end of the day. No SOB. Both legs equal. Has been eating more salt lately. No skin changes. Go down in the morning or after propping up for awhile. Has been treated for this several years ago and used to be on torsemide for it. Does have concurrent gout.   Review of Systems  Constitutional: Negative for fever, activity change, appetite change, fatigue and unexpected weight change.  Respiratory: Negative for cough, chest tightness, shortness of breath and wheezing.   Cardiovascular: Positive for leg swelling. Negative for chest pain and palpitations.  Gastrointestinal: Negative for nausea, abdominal pain, diarrhea, constipation and abdominal distention.  Musculoskeletal: Negative.   Skin: Negative.   Neurological: Negative.       Objective:   Physical Exam  Constitutional: She is oriented to person, place, and time. She appears well-developed and well-nourished.  HENT:  Head: Normocephalic and atraumatic.  Eyes: EOM are normal.  Neck: Normal range of motion. No thyromegaly present.  Cardiovascular: Normal rate and regular rhythm.   Pulmonary/Chest: Effort normal and breath sounds normal. No respiratory distress. She has no wheezes. She has no rales.  Abdominal: Soft. She exhibits no distension. There is no tenderness. There is no rebound.  Musculoskeletal: She exhibits edema.  1-2 + edema to mid shin bilaterally.   Lymphadenopathy:    She has no cervical adenopathy.  Neurological: She is alert and oriented to person, place, and time. Coordination normal.  Skin: Skin is warm and dry.  Psychiatric: She has a normal mood and affect.   Filed Vitals:   12/17/15 0818  BP: 160/82  Pulse: 76  Temp: 98.3 F (36.8 C)  TempSrc: Oral  Resp: 16  Height: 5\' 3"  (1.6 m)   Weight: 244 lb (110.678 kg)  SpO2: 98%     Assessment & Plan:  Flu and prevnar 13 given at visit.

## 2015-12-17 NOTE — Progress Notes (Signed)
Pre visit review using our clinic review tool, if applicable. No additional management support is needed unless otherwise documented below in the visit note. 

## 2015-12-17 NOTE — Assessment & Plan Note (Signed)
Currently on synthroid 50 mcg daily, checking TSH with next blood draw in 2 weeks and adjust as needed.

## 2015-12-17 NOTE — Patient Instructions (Signed)
We have given you the compression stocking prescription that you can get filled.   We have sent in lasix (furosemide) for the fluid. Take 1 pill daily for the fluid. If the stockings are able to keep the fluid off you may not need the medicine anymore.   We would like you to come in for labs in about 2-3 weeks (you can come down to the lab).   Work on increasing exercise to help with overall health.

## 2016-01-01 ENCOUNTER — Ambulatory Visit: Payer: Medicare Other | Admitting: Internal Medicine

## 2016-01-13 ENCOUNTER — Ambulatory Visit: Payer: Medicare Other | Admitting: Internal Medicine

## 2016-01-15 ENCOUNTER — Other Ambulatory Visit: Payer: Self-pay | Admitting: Geriatric Medicine

## 2016-01-15 ENCOUNTER — Telehealth: Payer: Self-pay | Admitting: Internal Medicine

## 2016-01-15 MED ORDER — METOPROLOL SUCCINATE ER 100 MG PO TB24: ORAL_TABLET | ORAL | Status: DC

## 2016-01-15 NOTE — Telephone Encounter (Signed)
Pt requesting refill for metoprolol succinate (TOPROL-XL) 100 MG 24 hr tablet [[id number]]  Pharmacy is [facility] on [address]

## 2016-01-15 NOTE — Telephone Encounter (Signed)
Sent to pharmacy 

## 2016-02-09 ENCOUNTER — Ambulatory Visit (INDEPENDENT_AMBULATORY_CARE_PROVIDER_SITE_OTHER): Payer: Medicare Other | Admitting: Internal Medicine

## 2016-02-09 ENCOUNTER — Encounter: Payer: Self-pay | Admitting: Internal Medicine

## 2016-02-09 ENCOUNTER — Other Ambulatory Visit (INDEPENDENT_AMBULATORY_CARE_PROVIDER_SITE_OTHER): Payer: Medicare Other

## 2016-02-09 VITALS — BP 164/90 | HR 64 | Temp 98.0°F | Resp 18 | Ht 63.0 in | Wt 245.0 lb

## 2016-02-09 DIAGNOSIS — R7301 Impaired fasting glucose: Secondary | ICD-10-CM

## 2016-02-09 DIAGNOSIS — I10 Essential (primary) hypertension: Secondary | ICD-10-CM

## 2016-02-09 DIAGNOSIS — I872 Venous insufficiency (chronic) (peripheral): Secondary | ICD-10-CM

## 2016-02-09 DIAGNOSIS — E039 Hypothyroidism, unspecified: Secondary | ICD-10-CM | POA: Diagnosis not present

## 2016-02-09 LAB — COMPREHENSIVE METABOLIC PANEL
ALBUMIN: 4 g/dL (ref 3.5–5.2)
ALK PHOS: 56 U/L (ref 39–117)
ALT: 13 U/L (ref 0–35)
AST: 16 U/L (ref 0–37)
BILIRUBIN TOTAL: 0.4 mg/dL (ref 0.2–1.2)
BUN: 13 mg/dL (ref 6–23)
CALCIUM: 9.8 mg/dL (ref 8.4–10.5)
CO2: 30 mEq/L (ref 19–32)
CREATININE: 0.99 mg/dL (ref 0.40–1.20)
Chloride: 106 mEq/L (ref 96–112)
GFR: 71.84 mL/min (ref >60.00)
Glucose, Bld: 90 mg/dL (ref 70–99)
Potassium: 4 mEq/L (ref 3.5–5.1)
SODIUM: 143 meq/L (ref 135–145)
TOTAL PROTEIN: 7.3 g/dL (ref 6.0–8.3)

## 2016-02-09 LAB — LIPID PANEL
CHOL/HDL RATIO: 4
CHOLESTEROL: 164 mg/dL (ref 0–200)
HDL: 44.6 mg/dL (ref >39.00)
LDL CALC: 97 mg/dL (ref 0–99)
NONHDL: 119.17
Triglycerides: 111 mg/dL (ref 0.0–149.0)
VLDL: 22.2 mg/dL (ref 0.0–40.0)

## 2016-02-09 LAB — HEMOGLOBIN A1C: Hgb A1c MFr Bld: 5.4 % (ref 4.6–6.5)

## 2016-02-09 LAB — T4, FREE: FREE T4: 1.03 ng/dL (ref 0.60–1.60)

## 2016-02-09 LAB — TSH: TSH: 3.16 u[IU]/mL (ref 0.35–4.50)

## 2016-02-09 NOTE — Progress Notes (Signed)
   Subjective:    Patient ID: Frances [name], female    DOB: [birth year], 70 y.o.   MRN: [id number]  HPI The patient is a 70 YO female coming in for follow up of her blood pressure. She has not taken her medicines today because she was worried that it would interfere with the blood work. She normally takes her medicines regularly. No headaches or chest pains. No SOB.   Review of Systems  Constitutional: Negative for fever, activity change, appetite change, fatigue and unexpected weight change.  HENT: Negative.   Eyes:       Dry eyes  Respiratory: Negative for cough, chest tightness, shortness of breath and wheezing.   Cardiovascular: Negative for chest pain, palpitations and leg swelling.  Gastrointestinal: Negative for nausea, abdominal pain, diarrhea, constipation and abdominal distention.  Musculoskeletal: Negative.   Skin: Negative.   Neurological: Negative.   Psychiatric/Behavioral: Negative.       Objective:   Physical Exam  Constitutional: She is oriented to person, place, and time. She appears well-developed and well-nourished.  HENT:  Head: Normocephalic and atraumatic.  Eyes: EOM are normal.  Neck: Normal range of motion. No thyromegaly present.  Cardiovascular: Normal rate and regular rhythm.   Pulmonary/Chest: Effort normal and breath sounds normal. No respiratory distress. She has no wheezes. She has no rales.  Abdominal: Soft. She exhibits no distension. There is no tenderness. There is no rebound.  Musculoskeletal: She exhibits no edema.  Lymphadenopathy:    She has no cervical adenopathy.  Neurological: She is alert and oriented to person, place, and time. Coordination normal.  Skin: Skin is warm and dry.  Psychiatric: She has a normal mood and affect.   Filed Vitals:   02/09/16 0944  BP: 164/90  Pulse: 64  Temp: 98 F (36.7 C)  TempSrc: Oral  Resp: 18  Height:  (1.6 m)  Weight: 245 lb (111.131 kg)  SpO2: 96%      Assessment & Plan:

## 2016-02-09 NOTE — Assessment & Plan Note (Signed)
Checking TSH and free T4 today and adjust as needed. On synthroid 50 mcg daily.

## 2016-02-09 NOTE — Progress Notes (Signed)
Pre visit review using our clinic review tool, if applicable. No additional management support is needed unless otherwise documented below in the visit note. 

## 2016-02-09 NOTE — Assessment & Plan Note (Signed)
BP previously well controlled with the lisinopril and metoprolol and amlodipine which she has not taken today. She never filled the lasix and has not been taking daily.

## 2016-02-09 NOTE — Patient Instructions (Signed)
We will check the blood work today and call you back with the results.   Keep working on being more active and exercising.   Exercising to Stay Healthy Exercising regularly is important. It has many health benefits, such as:  Improving your overall fitness, flexibility, and endurance.  Increasing your bone density.  Helping with weight control.  Decreasing your body fat.  Increasing your muscle strength.  Reducing stress and tension.  Improving your overall health. In order to become healthy and stay healthy, it is recommended that you do moderate-intensity and vigorous-intensity exercise. You can tell that you are exercising at a moderate intensity if you have a higher heart rate and faster breathing, but you are still able to hold a conversation. You can tell that you are exercising at a vigorous intensity if you are breathing much harder and faster and cannot hold a conversation while exercising. HOW OFTEN SHOULD I EXERCISE? Choose an activity that you enjoy and set realistic goals. Your health care provider can help you to make an activity plan that works for you. Exercise regularly as directed by your health care provider. This may include:   Doing resistance training twice each week, such as:  Push-ups.  Sit-ups.  Lifting weights.  Using resistance bands.  Doing a given intensity of exercise for a given amount of time. Choose from these options:  150 minutes of moderate-intensity exercise every week.  75 minutes of vigorous-intensity exercise every week.  A mix of moderate-intensity and vigorous-intensity exercise every week. Children, pregnant women, people who are out of shape, people who are overweight, and older adults may need to consult a health care provider for individual recommendations. If you have any sort of medical condition, be sure to consult your health care provider before starting a new exercise program.  WHAT ARE SOME EXERCISE IDEAS? Some  moderate-intensity exercise ideas include:   Walking at a rate of 1 mile in 15 minutes.  Biking.  Hiking.  Golfing.  Dancing. Some vigorous-intensity exercise ideas include:   Walking at a rate of at least 4.5 miles per hour.  Jogging or running at a rate of 5 miles per hour.  Biking at a rate of at least 10 miles per hour.  Lap swimming.  Roller-skating or in-line skating.  Cross-country skiing.  Vigorous competitive sports, such as football, basketball, and soccer.  Jumping rope.  Aerobic dancing. WHAT ARE SOME EVERYDAY ACTIVITIES THAT CAN HELP ME TO GET EXERCISE?  Yard work, such as:  Child psychotherapist.  Raking and bagging leaves.  Washing and waxing your car.  Pushing a stroller.  Shoveling snow.  Gardening.  Washing windows or floors. HOW CAN I BE MORE ACTIVE IN MY DAY-TO-DAY ACTIVITIES?  Use the stairs instead of the elevator.  Take a walk during your lunch break.  If you drive, park your car farther away from work or school.  If you take public transportation, get off one stop early and walk the rest of the way.  Make all of your phone calls while standing up and walking around.  Get up, stretch, and walk around every 30 minutes throughout the day. WHAT GUIDELINES SHOULD I FOLLOW WHILE EXERCISING?  Do not exercise so much that you hurt yourself, feel dizzy, or get very short of breath.  Consult your health care provider before starting a new exercise program.  Wear comfortable clothes and shoes with good support.  Drink plenty of water while you exercise to prevent dehydration or heat stroke.  Body water is lost during exercise and must be replaced.  Work out until you breathe faster and your heart beats faster.   This information is not intended to replace advice given to you by your health care provider. Make sure you discuss any questions you have with your health care provider.   Document Released: 01/08/2011 Document Revised:  12/27/2014 Document Reviewed: 05/09/2014 Elsevier Interactive Patient Education [employer].

## 2016-02-10 NOTE — Assessment & Plan Note (Signed)
Did not fill compression stockings or lasix, not bothering her right now.

## 2016-02-21 ENCOUNTER — Ambulatory Visit (INDEPENDENT_AMBULATORY_CARE_PROVIDER_SITE_OTHER): Payer: Medicare Other | Admitting: Family Medicine

## 2016-02-21 ENCOUNTER — Encounter: Payer: Self-pay | Admitting: Family Medicine

## 2016-02-21 ENCOUNTER — Telehealth: Payer: Self-pay

## 2016-02-21 VITALS — BP 130/90 | HR 79 | Temp 98.5°F | Wt 245.0 lb

## 2016-02-21 DIAGNOSIS — A084 Viral intestinal infection, unspecified: Secondary | ICD-10-CM | POA: Diagnosis not present

## 2016-02-21 MED ORDER — ONDANSETRON HCL 4 MG PO TABS: 4.0000 mg | ORAL_TABLET | Freq: Three times a day (TID) | ORAL | Status: DC | PRN

## 2016-02-21 NOTE — Telephone Encounter (Signed)
Can use immodium for diarrhea as needed, both diarrhea and abdominal pain with improve with time.  Focus on pushing fluids.

## 2016-02-21 NOTE — Telephone Encounter (Signed)
Pt called back and states she forgot to ask you what can she take to help stop the diarrhea and abd pain?

## 2016-02-21 NOTE — Patient Instructions (Signed)
Can use nausea med  as needed for nausea. Gradually increase fluids as able and advance diet as discussed. Call if not able to keep down liquids in next 12 hours.

## 2016-02-21 NOTE — Assessment & Plan Note (Signed)
Viral GE versus food toxin expoure.  Discussed avoidance of dehydration, push fluids.

## 2016-02-21 NOTE — Telephone Encounter (Signed)
Returned pt call and notified her of the below message.

## 2016-02-21 NOTE — Progress Notes (Signed)
   Subjective:    Patient ID: Jenean LindauBrenda Vara, female    DOB: Aug 29, 1946, 70 y.o.   MRN: [id number]  HPI   70 year old female pt with history of HTN, constipation presents with new onset N/V/D starting this AM. Large watery stool x 4-5 times, one episodes of emesis. Lower abdominal cramping, occ sharp pain started after N/V/D  No fever.  Last night ate a baked potato on Friday that did not taste right but ate it anyway.  She has not tried to drink today other than to take pills today, stayed down.   Social History /Family History/Past Medical History reviewed and updated if needed.  Review of Systems  Constitutional: Negative for fever and fatigue.  HENT: Negative for ear pain.   Eyes: Negative for pain.  Respiratory: Negative for chest tightness and shortness of breath.   Cardiovascular: Negative for chest pain, palpitations and leg swelling.  Gastrointestinal: Negative for abdominal pain.  Genitourinary: Negative for dysuria.       Objective:   Physical Exam  Constitutional: Vital signs are normal. She appears well-developed and well-nourished. She is cooperative.  Non-toxic appearance. She does not appear ill. No distress.  HENT:  Head: Normocephalic.  Right Ear: Hearing, tympanic membrane, external ear and ear canal normal. Tympanic membrane is not erythematous, not retracted and not bulging.  Left Ear: Hearing, tympanic membrane, external ear and ear canal normal. Tympanic membrane is not erythematous, not retracted and not bulging.  Nose: No mucosal edema or rhinorrhea. Right sinus exhibits no maxillary sinus tenderness and no frontal sinus tenderness. Left sinus exhibits no maxillary sinus tenderness and no frontal sinus tenderness.  Mouth/Throat: Uvula is midline, oropharynx is clear and moist and mucous membranes are normal.  Eyes: Conjunctivae, EOM and lids are normal. Pupils are equal, round, and reactive to light. Lids are everted and swept, no foreign bodies found.   Neck: Trachea normal and normal range of motion. Neck supple. Carotid bruit is not present. No thyroid mass and no thyromegaly present.  Cardiovascular: Normal rate, regular rhythm, S1 normal, S2 normal, normal heart sounds, intact distal pulses and normal pulses.  Exam reveals no gallop and no friction rub.   No murmur heard. Pulmonary/Chest: Effort normal and breath sounds normal. No tachypnea. No respiratory distress. She has no decreased breath sounds. She has no wheezes. She has no rhonchi. She has no rales.  Abdominal: Soft. Normal appearance and bowel sounds are normal. There is generalized tenderness.  Neurological: She is alert.  Skin: Skin is warm, dry and intact. No rash noted.  Psychiatric: Her speech is normal and behavior is normal. Judgment and thought content normal. Her mood appears not anxious. Cognition and memory are normal. She does not exhibit a depressed mood.          Assessment & Plan:

## 2016-05-24 DIAGNOSIS — H52223 Regular astigmatism, bilateral: Secondary | ICD-10-CM | POA: Diagnosis not present

## 2016-05-24 DIAGNOSIS — H524 Presbyopia: Secondary | ICD-10-CM | POA: Diagnosis not present

## 2016-05-24 DIAGNOSIS — H5203 Hypermetropia, bilateral: Secondary | ICD-10-CM | POA: Diagnosis not present

## 2016-05-24 DIAGNOSIS — H04123 Dry eye syndrome of bilateral lacrimal glands: Secondary | ICD-10-CM | POA: Diagnosis not present

## 2016-07-06 ENCOUNTER — Telehealth: Payer: Self-pay | Admitting: Internal Medicine

## 2016-07-06 NOTE — Telephone Encounter (Signed)
Can be added to nurse schedule for BP check only. Would need visit for dizziness, any spots tomorrow or spots at another office today?

## 2016-07-06 NOTE — Telephone Encounter (Signed)
Pt called and is dizzy and feel like bp it high but refuses to go to urgent care or er , wants to be worked in today.  No other appts here at [facility] today

## 2016-07-07 ENCOUNTER — Telehealth: Payer: Self-pay | Admitting: Internal Medicine

## 2016-07-07 ENCOUNTER — Telehealth: Payer: Self-pay | Admitting: *Deleted

## 2016-07-07 MED ORDER — METOPROLOL SUCCINATE ER 100 MG PO TB24: ORAL_TABLET | ORAL | Status: DC

## 2016-07-07 NOTE — Telephone Encounter (Signed)
Called pt home number left message

## 2016-07-07 NOTE — Telephone Encounter (Signed)
Received fax pt requesting refill pn her metoprolol. [facility]

## 2016-07-13 ENCOUNTER — Ambulatory Visit (INDEPENDENT_AMBULATORY_CARE_PROVIDER_SITE_OTHER): Payer: Medicare Other | Admitting: Internal Medicine

## 2016-07-13 ENCOUNTER — Encounter: Payer: Self-pay | Admitting: Internal Medicine

## 2016-07-13 DIAGNOSIS — G4733 Obstructive sleep apnea (adult) (pediatric): Secondary | ICD-10-CM

## 2016-07-13 DIAGNOSIS — I10 Essential (primary) hypertension: Secondary | ICD-10-CM

## 2016-07-13 NOTE — Progress Notes (Signed)
Pre visit review using our clinic review tool, if applicable. No additional management support is needed unless otherwise documented below in the visit note. 

## 2016-07-13 NOTE — Progress Notes (Signed)
   Subjective:    Patient ID: Keyashia Sinatra, female    DOB: 1946/02/01, 70 y.o.   MRN: [id number]  HPI The patient is a 70 YO female coming in for acute concern of weakness, fatigue for the last week or so. Her BP is running high at home. This is episodic only and she had 2 spells last week. They are always when she wakes up. She used to have a cpap machine but does not know what happened to it. Did 2 sleep studies and does not remember where but somewhere [place] [address]. This was about 5 years ago and her weight has not changed much since that time. Was with [facility] when this was ordered and we do not have their records available for review. Denies feeling sick or drinking or eating less during these episodes. When she took her BP meds after the onset of the episode her BP normalized that day. She did not seek care during the episodes. Denies chest pains, SOB, abdominal pain, diarrhea.   Review of Systems  Constitutional: Negative for activity change, appetite change, fatigue, fever and unexpected weight change.  Respiratory: Negative for cough, chest tightness, shortness of breath and wheezing.   Cardiovascular: Negative for chest pain, palpitations and leg swelling.  Gastrointestinal: Negative for abdominal distention, abdominal pain, constipation, diarrhea and nausea.  Musculoskeletal: Negative.   Skin: Negative.   Neurological: Positive for dizziness, light-headedness and headaches. Negative for tremors, syncope, speech difficulty, weakness and numbness.      Objective:   Physical Exam  Constitutional: She is oriented to person, place, and time. She appears well-developed and well-nourished.  Obese  HENT:  Head: Normocephalic and atraumatic.  Eyes: EOM are normal.  Neck: Normal range of motion. No JVD present.  Cardiovascular: Normal rate and regular rhythm.   Pulmonary/Chest: Effort normal and breath sounds normal. No respiratory distress. She has no wheezes. She has no rales.   Abdominal: Soft. She exhibits no distension. There is no tenderness. There is no rebound.  Musculoskeletal: She exhibits no edema.  Neurological: She is alert and oriented to person, place, and time. Coordination normal.  Skin: Skin is warm and dry.   Vitals:   07/13/16 0820  BP: 136/84  Pulse: 79  Resp: 20  Temp: 98.3 F (36.8 C)  TempSrc: Oral  SpO2: 97%  Weight: 246 lb (111.6 kg)  Height: 5\' 3"  (1.6 m)      Assessment & Plan:

## 2016-07-13 NOTE — Assessment & Plan Note (Signed)
Not on CPAP for some time. Having morning headaches which are severe and several episodes with high BP in the morning which can be attributed to the OSA. She will try to find the name of the place which did her study and if we can get a copy we can order her another CPAP. She knows that if we cannot obtain that study we would need to repeat for CPAP.

## 2016-07-13 NOTE — Assessment & Plan Note (Signed)
BP at goal on her medications including amlodipine, metoprolol, lisinopril and lasix prn. Last CMP at goal and will not recheck today.

## 2016-07-13 NOTE — Patient Instructions (Addendum)
Please call us with the name of the place that did the study for the sleep apnea so we can get the results.   Then we can get the cpap machine back to try to stop the morning headaches and the episodes of lightheadedness you are having with the high blood pressure.

## 2016-08-25 ENCOUNTER — Other Ambulatory Visit: Payer: Self-pay | Admitting: Internal Medicine

## 2016-09-14 ENCOUNTER — Other Ambulatory Visit: Payer: Self-pay | Admitting: Internal Medicine

## 2016-09-14 NOTE — Telephone Encounter (Signed)
Pt called in and needs 90 supply of all meds that are called in per ins . All the ones that were just called in she needs a 90 day instead of a 30 day   [facility] [address]

## 2016-10-27 ENCOUNTER — Ambulatory Visit (INDEPENDENT_AMBULATORY_CARE_PROVIDER_SITE_OTHER): Payer: Medicare Other

## 2016-10-27 DIAGNOSIS — Z23 Encounter for immunization: Secondary | ICD-10-CM

## 2017-01-25 ENCOUNTER — Telehealth: Payer: Self-pay | Admitting: Internal Medicine

## 2017-01-25 NOTE — Telephone Encounter (Signed)
Called patient to schedule awv. Left msg for patient to call and schedule

## 2017-03-07 ENCOUNTER — Other Ambulatory Visit: Payer: Self-pay | Admitting: Internal Medicine

## 2017-05-20 ENCOUNTER — Telehealth: Payer: Self-pay | Admitting: Internal Medicine

## 2017-05-20 NOTE — Telephone Encounter (Signed)
Spoke with patient regarding awv. Pt stated that she will call office to schedule appt.

## 2017-06-30 ENCOUNTER — Other Ambulatory Visit (INDEPENDENT_AMBULATORY_CARE_PROVIDER_SITE_OTHER): Payer: Medicare Other

## 2017-06-30 ENCOUNTER — Encounter: Payer: Self-pay | Admitting: Nurse Practitioner

## 2017-06-30 ENCOUNTER — Ambulatory Visit (INDEPENDENT_AMBULATORY_CARE_PROVIDER_SITE_OTHER): Payer: Medicare Other | Admitting: Nurse Practitioner

## 2017-06-30 VITALS — BP 138/80 | HR 64 | Temp 98.5°F | Ht 63.0 in | Wt 243.0 lb

## 2017-06-30 DIAGNOSIS — I10 Essential (primary) hypertension: Secondary | ICD-10-CM

## 2017-06-30 DIAGNOSIS — E039 Hypothyroidism, unspecified: Secondary | ICD-10-CM

## 2017-06-30 DIAGNOSIS — R238 Other skin changes: Secondary | ICD-10-CM | POA: Diagnosis not present

## 2017-06-30 DIAGNOSIS — R739 Hyperglycemia, unspecified: Secondary | ICD-10-CM

## 2017-06-30 DIAGNOSIS — R233 Spontaneous ecchymoses: Secondary | ICD-10-CM

## 2017-06-30 DIAGNOSIS — M1A9XX0 Chronic gout, unspecified, without tophus (tophi): Secondary | ICD-10-CM

## 2017-06-30 LAB — HEPATIC FUNCTION PANEL
ALK PHOS: 53 U/L (ref 39–117)
ALT: 13 U/L (ref 0–35)
AST: 15 U/L (ref 0–37)
Albumin: 3.7 g/dL (ref 3.5–5.2)
Bilirubin, Direct: 0.1 mg/dL (ref 0.0–0.3)
TOTAL PROTEIN: 6.7 g/dL (ref 6.0–8.3)
Total Bilirubin: 0.4 mg/dL (ref 0.2–1.2)

## 2017-06-30 LAB — CBC
HEMATOCRIT: 40.5 % (ref 36.0–46.0)
HEMOGLOBIN: 13.4 g/dL (ref 12.0–15.0)
MCHC: 33.1 g/dL (ref 30.0–36.0)
MCV: 85.6 fl (ref 78.0–100.0)
PLATELETS: 318 10*3/uL (ref 150.0–400.0)
RBC: 4.73 Mil/uL (ref 3.87–5.11)
RDW: 15.9 % — AB (ref 11.5–15.5)
WBC: 8 10*3/uL (ref 4.0–10.5)

## 2017-06-30 LAB — BASIC METABOLIC PANEL
BUN: 12 mg/dL (ref 6–23)
CALCIUM: 9.3 mg/dL (ref 8.4–10.5)
CHLORIDE: 105 meq/L (ref 96–112)
CO2: 32 meq/L (ref 19–32)
Creatinine, Ser: 1.02 mg/dL (ref 0.40–1.20)
GFR: 69.12 mL/min (ref >60.00)
GLUCOSE: 112 mg/dL — AB (ref 70–99)
Potassium: 4 mEq/L (ref 3.5–5.1)
SODIUM: 143 meq/L (ref 135–145)

## 2017-06-30 LAB — TSH: TSH: 1.67 u[IU]/mL (ref 0.35–4.50)

## 2017-06-30 LAB — T4, FREE: FREE T4: 1.01 ng/dL (ref 0.60–1.60)

## 2017-06-30 LAB — HEMOGLOBIN A1C: HEMOGLOBIN A1C: 5.4 % (ref 4.6–6.5)

## 2017-06-30 LAB — URIC ACID: Uric Acid, Serum: 7 mg/dL (ref 2.4–7.0)

## 2017-06-30 MED ORDER — AMLODIPINE BESYLATE 5 MG PO TABS: 5.0000 mg | ORAL_TABLET | Freq: Every day | ORAL | 1 refills | Status: DC

## 2017-06-30 MED ORDER — COLCHICINE 0.6 MG PO TABS: 0.6000 mg | ORAL_TABLET | Freq: Two times a day (BID) | ORAL | 0 refills | Status: DC | PRN

## 2017-06-30 MED ORDER — METOPROLOL SUCCINATE ER 100 MG PO TB24
ORAL_TABLET | ORAL | 1 refills | Status: DC
Start: 2017-06-30 — End: 2018-03-14

## 2017-06-30 MED ORDER — LISINOPRIL 40 MG PO TABS: ORAL_TABLET | ORAL | 1 refills | Status: DC

## 2017-06-30 MED ORDER — LEVOTHYROXINE SODIUM 50 MCG PO TABS: 50.0000 ug | ORAL_TABLET | Freq: Every day | ORAL | 1 refills | Status: DC

## 2017-06-30 NOTE — Assessment & Plan Note (Signed)
Uric acid level of 7.0 today. Use colchicine prn

## 2017-06-30 NOTE — Assessment & Plan Note (Addendum)
At goal. Continue current medications. BMP Latest Ref Rng & Units 06/30/2017 02/09/2016 01/19/2011  Glucose 70 - 99 mg/dL 213(Y112(H) 90 96  BUN 6 - 23 mg/dL 12 13 8   Creatinine 0.40 - 1.20 mg/dL 8.651.02 7.840.99 6.960.95  Sodium 135 - 145 mEq/L 143 143 143  Potassium 3.5 - 5.1 mEq/L 4.0 4.0 3.8  Chloride 96 - 112 mEq/L 105 106 104  CO2 19 - 32 mEq/L 32 30 29  Calcium 8.4 - 10.5 mg/dL 9.3 9.8 9.5

## 2017-06-30 NOTE — Progress Notes (Signed)
Subjective:  Patient ID: Frances LindauBrenda Ocallaghan, female    DOB: 1946/03/27  Age: 71 y.o. MRN: [id number]  CC: Fatigue (extreamly fatigue, bruise easy, handicaple placard form fill out--gout med/itching?)   HPI Fatigue: Chronic, constant, ongoing for several years Stable per patient Not using CPAP at this time. Lost CPAP when moving. Does not want another one at this time. Denies any anxiety or depression.  Gout: Diagnosed 2012 by previous pcp. Last uric acid level done 2012: 7.2. Had 2exacerbations in last 6months. Needs colchicine refill. Current use of naproxen prn with some relief.  HTN: takes medication as prescribed. Does not take BP at home. BP Readings from Last 3 Encounters:  06/30/17 138/80  07/13/16 136/84  02/21/16 130/90   Bruising: Onset over 19month ago. Noticed random bruising without injury. Denies any GI or GU bleed or nosebleed. Denies use of   Outpatient Medications Prior to Visit  Medication Sig Dispense Refill  . aspirin 81 MG tablet Take 81 mg by mouth daily.    . Multiple Vitamin (MULTIVITAMINS PO) Take by mouth.    Marland Kitchen. amLODipine (NORVASC) 5 MG tablet TAKE 1 TABLET (5 MG TOTAL) BY MOUTH DAILY. 90 tablet 3  . levothyroxine (SYNTHROID, LEVOTHROID) 50 MCG tablet TAKE 1 TABLET (50 MCG TOTAL) BY MOUTH DAILY. 90 tablet 3  . lisinopril (PRINIVIL,ZESTRIL) 40 MG tablet TAKE 1 TABLET (40 MG TOTAL) BY MOUTH DAILY. 90 tablet 3  . metoprolol succinate (TOPROL-XL) 100 MG 24 hr tablet TAKE 1 TABLET (100 MG TOTAL) BY MOUTH 2 (TWO) TIMES DAILY. 180 tablet 1  . furosemide (LASIX) 20 MG tablet Take 1 tablet (20 mg total) by mouth daily. (Patient not taking: Reported on 06/30/2017) 30 tablet 3   No facility-administered medications prior to visit.     ROS Review of Systems  Constitutional: Positive for malaise/fatigue. Negative for chills, fever and weight loss.  HENT: Negative for congestion and sore throat.   Eyes:       Negative for visual changes  Respiratory:  Negative for cough and shortness of breath.   Cardiovascular: Negative for chest pain, palpitations and leg swelling.  Gastrointestinal: Negative for abdominal pain, blood in stool, constipation, diarrhea, heartburn, melena and nausea.  Genitourinary: Negative for dysuria, frequency and urgency.  Musculoskeletal: Negative for falls, joint pain and myalgias.  Skin: Negative for rash.  Neurological: Negative for dizziness, sensory change, weakness and headaches.  Endo/Heme/Allergies: Negative for polydipsia. Bruises/bleeds easily.  Psychiatric/Behavioral: Negative for depression, substance abuse and suicidal ideas. The patient is not nervous/anxious and does not have insomnia.     Objective:  BP 138/80   Pulse 64   Temp 98.5 F (36.9 C)   Ht 5\' 3"  (1.6 m)   Wt 243 lb (110.2 kg)   SpO2 98%   BMI 43.05 kg/m   BP Readings from Last 3 Encounters:  06/30/17 138/80  07/13/16 136/84  02/21/16 130/90    Wt Readings from Last 3 Encounters:  06/30/17 243 lb (110.2 kg)  07/13/16 246 lb (111.6 kg)  02/21/16 245 lb (111.1 kg)    Physical Exam  Constitutional: She is oriented to person, place, and time. No distress.  Neck: Normal range of motion. Neck supple. No thyromegaly present.  Cardiovascular: Normal rate, regular rhythm and normal heart sounds.   Pulmonary/Chest: Effort normal and breath sounds normal.  Musculoskeletal: She exhibits no edema or tenderness.  Lymphadenopathy:    She has no cervical adenopathy.  Neurological: She is alert and oriented to person, place, and time.  Skin: Skin is warm and dry.  Vitals reviewed.   Lab Results  Component Value Date   WBC 8.0 06/30/2017   HGB 13.4 06/30/2017   HCT 40.5 06/30/2017   PLT 318.0 06/30/2017   GLUCOSE 112 (H) 06/30/2017   CHOL 164 02/09/2016   TRIG 111.0 02/09/2016   HDL 44.60 02/09/2016   LDLCALC 97 02/09/2016   ALT 13 06/30/2017   AST 15 06/30/2017   NA 143 06/30/2017   K 4.0 06/30/2017   CL 105 06/30/2017    CREATININE 1.02 06/30/2017   BUN 12 06/30/2017   CO2 32 06/30/2017   TSH 1.67 06/30/2017   HGBA1C 5.4 06/30/2017    Mr Brain W ZO Contrast  Result Date: 05/14/2011 *RADIOLOGY REPORT* Clinical Data:   Current vertigo. MRI HEAD WITHOUT AND WITH CONTRAST Technique:  Multiplanar, multiecho pulse sequences of the brain and surrounding structures were obtained according to standard protocol without and with intravenous contrast Contrast:  Multihance 20 ml.  (creat1.0. bun11.GFR >60). Comparison: None. Findings: Wallace Cullens white matter differentiation is normal for patient's age. Partial empty sella is noted.  Clival signal is mildly heterogeneous.  The cerebellar tonsils are just above the level of the foramen  magnum. The odontoid process, the predental space and the prevertebral soft tissues are within normal limits. There is mild prominence of the posterior transverse ligament of the dens with mild overlying subarachnoid space narrowing. Prominence of the soft tissues is seen in the nasopharynx probably representing reactive lymphoid tissue. No acute diffusion weighted abnormalities seen Axial FLAIR and T2-weighted images demonstrate a few scattered focal signal hyperintensities   in the subcortical white matter bifrontally, the centrum semi ovale ,the parasagittal bifrontal and biparietal subcortical white matter. Ventricles are normal. Mastoids are clear.  The internal auditory canals are symmetrical in signal and morphology. No abnormal mass lesions, or pathological enhancement are  seen in the region of the vestibulocochlear nerve complexes bilaterally. Flow voids are maintained in the major vessels at the cranial skull base.  The visualized orbital contents are grossly normal. There is mild inflammatory thickening  of the mucosa in the ethmoid air cells and the maxillary sinuses. No abnormal blood breakdown products seen on SPGR sequences. Dural venous sinuses are grossly patent. Impression. 1. No evidence of  acute ischemia. 2.  Nonspecific subcortical white matter changes supratentorially. These probably represent areas of ischemic gliosis   related  to small vessel disease due to chronic hypertension and/or diabetes versus a demyelinating process or vasculitis. 3.  No pathological mass lesions or enhancement seen in the cerebellopontine angle regions bilaterally. Original Report Authenticated By: Oneal Grout, M.D.   Assessment & Plan:   Laresa was seen today for fatigue.  Diagnoses and all orders for this visit:  Essential hypertension -     Basic metabolic panel; Future -     amLODipine (NORVASC) 5 MG tablet; Take 1 tablet (5 mg total) by mouth daily. -     lisinopril (PRINIVIL,ZESTRIL) 40 MG tablet; TAKE 1 TABLET (40 MG TOTAL) BY MOUTH DAILY. -     metoprolol succinate (TOPROL-XL) 100 MG 24 hr tablet; TAKE 1 TABLET (100 MG TOTAL) BY MOUTH 2 (TWO) TIMES DAILY.  Hypothyroidism, unspecified type -     TSH; Future -     T4, free; Future -     levothyroxine (SYNTHROID, LEVOTHROID) 50 MCG tablet; Take 1 tablet (50 mcg total) by mouth daily before breakfast.  Chronic gout involving toe without tophus, unspecified cause, unspecified laterality -  Uric acid; Future -     colchicine 0.6 MG tablet; Take 1 tablet (0.6 mg total) by mouth 2 (two) times daily between meals as needed.  Hyperglycemia -     Hemoglobin A1c; Future  Easy bruising -     CBC; Future -     Hepatic function panel; Future   I have discontinued Ms. Overstreet's furosemide. I have also changed her levothyroxine. Additionally, I am having her start on colchicine. Lastly, I am having her maintain her Multiple Vitamin (MULTIVITAMINS PO), aspirin, Naproxen Sodium (ALEVE PO), amLODipine, lisinopril, and metoprolol succinate.  Meds ordered this encounter  Medications  . Naproxen Sodium (ALEVE PO)    Sig: Take by mouth.  Marland Kitchen amLODipine (NORVASC) 5 MG tablet    Sig: Take 1 tablet (5 mg total) by mouth daily.    Dispense:   90 tablet    Refill:  1    PT REQUESTS REFILL    Order Specific Question:   Supervising Provider    Answer:   Tresa Garter [[id number]]  . levothyroxine (SYNTHROID, LEVOTHROID) 50 MCG tablet    Sig: Take 1 tablet (50 mcg total) by mouth daily before breakfast.    Dispense:  90 tablet    Refill:  1    PT REQUEST 90 DAY SUPPLY    Order Specific Question:   Supervising Provider    Answer:   Tresa Garter [[id number]]  . lisinopril (PRINIVIL,ZESTRIL) 40 MG tablet    Sig: TAKE 1 TABLET (40 MG TOTAL) BY MOUTH DAILY.    Dispense:  90 tablet    Refill:  1    PT REQUESTS REFILL    Order Specific Question:   Supervising Provider    Answer:   Tresa Garter [[id number]]  . metoprolol succinate (TOPROL-XL) 100 MG 24 hr tablet    Sig: TAKE 1 TABLET (100 MG TOTAL) BY MOUTH 2 (TWO) TIMES DAILY.    Dispense:  180 tablet    Refill:  1    Order Specific Question:   Supervising Provider    Answer:   Tresa Garter [[id number]]  . colchicine 0.6 MG tablet    Sig: Take 1 tablet (0.6 mg total) by mouth 2 (two) times daily between meals as needed.    Dispense:  10 tablet    Refill:  0    Order Specific Question:   Supervising Provider    Answer:   Tresa Garter [[id number]]    Follow-up: Return in about 6 months (around 12/31/2017) for HTN and hypothyroidism.  Alysia Penna, NP

## 2017-06-30 NOTE — Assessment & Plan Note (Signed)
Maintain current dose of levothyroxine

## 2017-06-30 NOTE — Patient Instructions (Addendum)
Please go to basement for blood draw  Handicap placard form completed and original returned to patient today.  Avoid excessive use of aspirin or NSAids. Normal CBC abd hepatic function. Unknown cause of bruising at this time.

## 2017-09-12 ENCOUNTER — Other Ambulatory Visit: Payer: Self-pay | Admitting: Internal Medicine

## 2017-09-12 NOTE — Telephone Encounter (Signed)
On 7.12.18 #[id number] was faxed/thx dmf

## 2017-11-17 ENCOUNTER — Ambulatory Visit (INDEPENDENT_AMBULATORY_CARE_PROVIDER_SITE_OTHER): Payer: Medicare Other | Admitting: Internal Medicine

## 2017-11-17 ENCOUNTER — Encounter: Payer: Self-pay | Admitting: Internal Medicine

## 2017-11-17 DIAGNOSIS — B9789 Other viral agents as the cause of diseases classified elsewhere: Secondary | ICD-10-CM

## 2017-11-17 DIAGNOSIS — J069 Acute upper respiratory infection, unspecified: Secondary | ICD-10-CM | POA: Diagnosis not present

## 2017-11-17 MED ORDER — BENZONATATE 100 MG PO CAPS: 100.0000 mg | ORAL_CAPSULE | Freq: Two times a day (BID) | ORAL | 0 refills | Status: DC | PRN

## 2017-11-17 NOTE — Patient Instructions (Signed)
We would recommend to take zyrtec (cetirizine) for the virus.  If you are not better by next Tuesday call us back.  We have sent in a cough medicine to take up to 3 times per day.

## 2017-11-17 NOTE — Progress Notes (Signed)
   Subjective:    Patient ID: Frances [name], female    DOB: 09-17-1946, 71 y.o.   MRN: [id number]  HPI The patient is a 71 YO female coming in for sore throat about 3 days ago. She is also having some chills and fever. She is also having nasal congestion and drainage. Some yellow mucus with the cough. Denies SOB. Overall stable since onset. She denies trying anything for the symptoms.  Review of Systems  Constitutional: Positive for activity change, appetite change and chills. Negative for fatigue, fever and unexpected weight change.  HENT: Positive for congestion, postnasal drip, rhinorrhea and sinus pressure. Negative for ear discharge, ear pain, sinus pain, sneezing, sore throat, tinnitus, trouble swallowing and voice change.   Eyes: Negative.   Respiratory: Positive for cough. Negative for chest tightness, shortness of breath and wheezing.   Cardiovascular: Negative.   Gastrointestinal: Negative.   Musculoskeletal: Positive for myalgias.  Neurological: Negative.       Objective:   Physical Exam  Constitutional: She is oriented to person, place, and time. She appears well-developed and well-nourished.  HENT:  Head: Normocephalic and atraumatic.  Oropharynx with redness and clear drainage, nose with swollen turbinates, TMs normal bilaterally  Eyes: EOM are normal.  Neck: Normal range of motion. No thyromegaly present.  Cardiovascular: Normal rate and regular rhythm.  Pulmonary/Chest: Effort normal and breath sounds normal. No respiratory distress. She has no wheezes. She has no rales.  Abdominal: Soft.  Lymphadenopathy:    She has no cervical adenopathy.  Neurological: She is alert and oriented to person, place, and time.  Skin: Skin is warm and dry.   Vitals:   11/17/17 1433  BP: 130/80  Pulse: 73  Temp: 98.4 F (36.9 C)  TempSrc: Oral  SpO2: 99%  Weight: 237 lb (107.5 kg)  Height: 5\' 3"  (1.6 m)      Assessment & Plan:

## 2017-11-17 NOTE — Assessment & Plan Note (Addendum)
Tessalon perles for cough. Zyrtec for drainage. No indication for antibiotics or steroids.

## 2018-01-04 ENCOUNTER — Ambulatory Visit: Payer: Self-pay

## 2018-01-04 NOTE — Telephone Encounter (Signed)
Patient called in with c/o "chest discomfort." She says "I've had something similar to this when I spray aerosol and I've been spraying lysol last night. I woke up this morning with the chest discomfort and shortness of breath. I checked my BP and it was 164/87. I took metoprolol 100 mg and aspirin 81 mg. Around 1545, I checked my BP and it was 181/104 P90, I took my lisinopril 40 mg and another aspirin 81 mg. I took another aspirin around 1600." I asked is the pain constant, she says "it's not sharp or anything, it's just a discomfort and when I take a deep breath it's more noticeable, then it eases down, but stays there." According to the protocol, go to the ED. I advised patient of the protocol, she says "I don't want to go and sit in the ED all that time, I just want to be seen in the office and can come right now if there is an opening." I advised that when the provider sees this note of her having chest discomfort, they may call and tell you to go to the ED, because they can do tests to see if it's your heart or not. She said "I will see if my daughter can take me," then she hung the phone up without any care advice being given.    Reason for Disposition . Taking a deep breath makes pain worse  Answer Assessment - Initial Assessment Questions 1. LOCATION: "Where does it hurt?"       Center, upper part, discomfort only 2. RADIATION: "Does the pain go anywhere else?" (e.g., into neck, jaw, arms, back)     A little in my throat 3. ONSET: "When did the chest pain begin?" (Minutes, hours or days)      Today around 7-8 am when I got up 4. PATTERN "Does the pain come and go, or has it been constant since it started?"  "Does it get worse with exertion?"      Constant, more noticeable with exertion 5. DURATION: "How long does it last" (e.g., seconds, minutes, hours)     Constant off and on intensity 6. SEVERITY: "How bad is the pain?"  (e.g., Scale 1-10; mild, moderate, or severe)    - MILD (1-3):  doesn't interfere with normal activities     - MODERATE (4-7): interferes with normal activities or awakens from sleep    - SEVERE (8-10): excruciating pain, unable to do any normal activities       Moderate discomfort, not sharp 7. CARDIAC RISK FACTORS: "Do you have any history of heart problems or risk factors for heart disease?" (e.g., prior heart attack, angina; high blood pressure, diabetes, being overweight, high cholesterol, smoking, or strong family history of heart disease)     High blood pressure, irregular heart beat in 2006, hypothyroid 8. PULMONARY RISK FACTORS: "Do you have any history of lung disease?"  (e.g., blood clots in lung, asthma, emphysema, birth control pills)     No 9. CAUSE: "What do you think is causing the chest pain?"     No idea 10. OTHER SYMPTOMS: "Do you have any other symptoms?" (e.g., dizziness, nausea, vomiting, sweating, fever, difficulty breathing, cough)      Cough, high blood pressure, SOB(mild) 11. PREGNANCY: "Is there any chance you are pregnant?" "When was your last menstrual period?"      No  Protocols used: CHEST PAIN-A-AH

## 2018-01-06 ENCOUNTER — Ambulatory Visit: Payer: Medicare Other | Admitting: Internal Medicine

## 2018-01-06 ENCOUNTER — Telehealth: Payer: Self-pay | Admitting: Internal Medicine

## 2018-01-06 NOTE — Telephone Encounter (Signed)
Pt. Called to cancel appt. Let her know about $50 charge with policy. Called over to practice and talked to carson and Tammy ok that pt. Will not get charged due to felling better. Appt. Was sched. For today 01-06-18 @ 11:15 with Dr. Okey Duprerawford.

## 2018-01-06 NOTE — Telephone Encounter (Signed)
Appointment for patient made. 

## 2018-02-28 ENCOUNTER — Encounter: Payer: Self-pay | Admitting: Internal Medicine

## 2018-02-28 ENCOUNTER — Ambulatory Visit (INDEPENDENT_AMBULATORY_CARE_PROVIDER_SITE_OTHER): Payer: Medicare Other | Admitting: Internal Medicine

## 2018-02-28 DIAGNOSIS — B9789 Other viral agents as the cause of diseases classified elsewhere: Secondary | ICD-10-CM

## 2018-02-28 DIAGNOSIS — J069 Acute upper respiratory infection, unspecified: Secondary | ICD-10-CM | POA: Diagnosis not present

## 2018-02-28 DIAGNOSIS — K5900 Constipation, unspecified: Secondary | ICD-10-CM

## 2018-02-28 DIAGNOSIS — R002 Palpitations: Secondary | ICD-10-CM | POA: Diagnosis not present

## 2018-02-28 MED ORDER — PREDNISONE 20 MG PO TABS: 40.0000 mg | ORAL_TABLET | Freq: Every day | ORAL | 0 refills | Status: DC

## 2018-02-28 NOTE — Assessment & Plan Note (Signed)
Rx for prednisone burst to help with nasal drainage and advised to start zyrtec or claritin. She has tessalon perles and denies need for refill or additional cough medicine today.

## 2018-02-28 NOTE — Patient Instructions (Addendum)
We have sent in prednisone to take 2 pills daily for 5 days.   We would recommend taking zyrtec or claritin.   Senokot-s is a medicine that you can take up to twice a day for constipation.

## 2018-02-28 NOTE — Assessment & Plan Note (Signed)
Recommended EKG today and referral to cardiology for holter monitoring but she does not feel able to do this today. 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

## 2018-02-28 NOTE — Assessment & Plan Note (Signed)
Taking miralax daily and has taken senokot-d in the past. Advised on maximum daily dose of senokot d and that she can take concurrent miralax. She does not wish to do prescription medication at this time. She has not had colonoscopy in the past and declines referral at this time but wants to talk about again in the near future.

## 2018-02-28 NOTE — Progress Notes (Signed)
   Subjective:    Patient ID: Frances [name], female    DOB: 07-19-1946, 72 y.o.   MRN: [id number]  HPI The patient is a 72 YO female coming in for sinus problems. She is having them for about 1 week or so with nasal congestion and dripping. She is coughing some with green or yellow sputum. She cannot sleep with the cough. She is taking otc cough medicine without relief.   She is also having several other concerns including constipation (BM every several days, taking miralax daily, had taken senokot-d in the past and is not sure if that is safe for long term use, she denies abdominal pain, denies blood in stool, is still able to pass gas) and heart palpitations (history of bigeminy, having more fluttering and palpitations lately, not in 1-2 days, she is concerned about her heart, denies chest pains or tightness, taking aspirin daily).   Review of Systems  Constitutional: Negative for activity change, appetite change, chills, fatigue, fever and unexpected weight change.  HENT: Positive for congestion, postnasal drip, rhinorrhea and sinus pressure. Negative for ear discharge, ear pain, sinus pain, sneezing, sore throat, tinnitus, trouble swallowing and voice change.   Eyes: Negative.   Respiratory: Positive for cough. Negative for chest tightness, shortness of breath and wheezing.   Cardiovascular: Positive for palpitations. Negative for chest pain and leg swelling.  Gastrointestinal: Positive for constipation. Negative for abdominal distention, abdominal pain, diarrhea, nausea and vomiting.  Musculoskeletal: Negative.   Skin: Negative.   Neurological: Negative.   Psychiatric/Behavioral: Negative.       Objective:   Physical Exam  Constitutional: She is oriented to person, place, and time. She appears well-developed and well-nourished.  HENT:  Head: Normocephalic and atraumatic.  Oropharynx with redness and clear drainage, nose with swollen turbinates, TMs normal bilaterally  Eyes: EOM are  normal.  Neck: Normal range of motion. No thyromegaly present.  Cardiovascular: Normal rate and regular rhythm.  Normal on auscultation.   Pulmonary/Chest: Effort normal and breath sounds normal. No respiratory distress. She has no wheezes. She has no rales.  Abdominal: Soft. Bowel sounds are normal. She exhibits no distension. There is no tenderness. There is no rebound.  Musculoskeletal: She exhibits no tenderness.  Lymphadenopathy:    She has no cervical adenopathy.  Neurological: She is alert and oriented to person, place, and time. Coordination normal.  Skin: Skin is warm and dry.   Vitals:   02/28/18 0937  BP: 134/84  Pulse: 73  Temp: 98.6 F (37 C)  TempSrc: Oral  SpO2: 96%  Weight: 234 lb (106.1 kg)  Height: 5\' 3"  (1.6 m)      Assessment & Plan:

## 2018-03-02 ENCOUNTER — Ambulatory Visit: Payer: Self-pay

## 2018-03-02 NOTE — Telephone Encounter (Signed)
Can continue as long as symptoms are tolerable.

## 2018-03-02 NOTE — Telephone Encounter (Signed)
Called patient and informed of MD response. Patient stated understanding

## 2018-03-02 NOTE — Telephone Encounter (Signed)
Message from Frances PettiesKristie S [name] sent at 03/02/2018 12:42 PM EDT   Summary: did not want to hold, chest discomfort/burning last night   Pt on the line stating that last night she took prednisone that was ordered. She said that she felt chest discomfort and burning. She talked to the pharmacist and was advised it could be from the medication and to contact her doctor before d/c use. Pt requesting call back did not want to hold for a nurse. She states no chest discomfort/burning today.         Returned call to pt.  Reported she was prescribed Prednisone for URI on Tuesday.  Reported she started the Prednisone 20 mg, 2 tablets yesterday at 9:00 AM.  Stated she started having chest discomfort like a burning sensation about 6:00 PM.  Denied radiation of pain, nausea, vomiting, sweating, or shortness of breath.  Stated "I'm always a little short of breath, so nothing was unusual."  Reported the pain was in the mid chest, and lasted several hours, until she went to bed.   Stated she called the pharmacy to inquire about the Prednisone.  Was advised the Prednisone could have caused the chest discomfort, but not to stop it, without talking to the doctor, first.  Reported she did not take it today, until she hears back from the doctor.  Wanted to know if she can just stop it.  Reported her coughing did improve during the night, and she was able to get more sleep.   Pt. denied any chest discomfort when she woke up today.  Denied any problems throughout the day today.   Advised will make Dr. Okey Duprerawford aware of her symptoms, and need for further recommendation.    Reason for Disposition . [1] Patient claims chest pain is same as previously diagnosed "heartburn" AND [2] describes burning in chest AND [3] accompanying sour taste in mouth    C/o burning in mid chest that lasted several hours; denied other symptoms associated with the chest burning; questioned if it was a side effect of Prednisone.  Answer Assessment -  Initial Assessment Questions 1. LOCATION: "Where does it hurt?"       In center of chest, "like a burning"   2. RADIATION: "Does the pain go anywhere else?" (e.g., into neck, jaw, arms, back)     Denied any radiation  3. ONSET: "When did the chest pain begin?" (Minutes, hours or days)     Last evening; unsure what time.  It may have lasted about 5-6 hours prior to going to bed; felt most of the discomfort when she layed down to go to sleep  4. PATTERN "Does the pain come and go, or has it been constant since it started?"  "Does it get worse with exertion?"      Stayed steady last night 5. DURATION: "How long does it last" (e.g., seconds, minutes, hours)     Lasted about 5-6 hours.  6. SEVERITY: "How bad is the pain?"  (e.g., Scale 1-10; mild, moderate, or severe)    - MILD (1-3): doesn't interfere with normal activities     - MODERATE (4-7): interferes with normal activities or awakens from sleep    - SEVERE (8-10): excruciating pain, unable to do any normal activities       moderate 7. CARDIAC RISK FACTORS: "Do you have any history of heart problems or risk factors for heart disease?" (e.g., prior heart attack, angina; high blood pressure, diabetes, being overweight, high cholesterol, smoking, or strong  family history of heart disease)   High BP, overweight   8. PULMONARY RISK FACTORS: "Do you have any history of lung disease?"  (e.g., blood clots in lung, asthma, emphysema, birth control pills)     No lung disease  9. CAUSE: "What do you think is causing the chest pain?"     Called pharmacy and was informed it could be caused by the Prednisone 10. OTHER SYMPTOMS: "Do you have any other symptoms?" (e.g., dizziness, nausea, vomiting, sweating, fever, difficulty breathing, cough)       Denied nausea, sweating, shortness of breath that has changed; reported she stays short of breath; reported she has Vertigo and stays dizzy   11. PREGNANCY: "Is there any chance you are pregnant?" "When was your  last menstrual period?"      No  Protocols used: CHEST PAIN-A-AH

## 2018-03-13 ENCOUNTER — Telehealth: Payer: Self-pay | Admitting: Internal Medicine

## 2018-03-13 ENCOUNTER — Ambulatory Visit: Payer: Self-pay

## 2018-03-13 NOTE — Telephone Encounter (Signed)
Copied from CRM [id number]. Topic: Quick Communication - Rx Refill/Question >> Mar 13, 2018 10:57 AM Crist InfanteHarrald, Kathy J wrote: Medication: amLODipine (NORVASC) 5 MG tablet lisinopril (PRINIVIL,ZESTRIL) 40 MG tablet metoprolol succinate (TOPROL-XL) 100 MG 24 hr tablet levothyroxine (SYNTHROID, LEVOTHROID) 50 MCG tablet 90 day  Has the patient contacted their pharmacy? Yes but I do not see any request  Pt just 02/28/18 Pt states she called the pharmacy last week.  [id number] [place],  - [address] [phone] (Phone) [phone] (Fax)

## 2018-03-13 NOTE — Telephone Encounter (Signed)
Pt calling with new complaint see todays triage call

## 2018-03-13 NOTE — Telephone Encounter (Signed)
Pt states she continues to have cold symptoms, coughing. Pt states she only took one dose (2 tabs) of the prednisone due to having "chest pains" . Previous message states chest burning. But pt is saying it caused chest pain. Pt states she was not happy with the message she got about stopping the prednisone. Pt has been taking OTC meds (Dayquil and Coricidin HBP). Pt states she is still sick, and thinks she needs a abx now. Pt states the dr did tell her she would call in a cough med. Pt stated  That she cannot take liquid cough medicine because it makes her sick and the cough pills dont work. Pt cannot sleep, and the congestion, cough and drainage is continuous. Pt is having greenish yellow phlegm. Pt states chest is congested and throat is sore from nasal drainage.  Pt given appt for tomorrow with Dr Jordan LikesSchmitz.   Reason for Disposition . Cough has been present for > 3 weeks  Answer Assessment - Initial Assessment Questions 1. ONSET: "When did the cough begin?"      Beginning of March 2. SEVERITY: "How bad is the cough today?"      Cough produces a lot of phlegm and having a lot of head congestion 3. RESPIRATORY DISTRESS: "Describe your breathing."      Congestion you can hear the "rattle" 4. FEVER: "Do you have a fever?" If so, ask: "What is your temperature, how was it measured, and when did it start?"     no 5. SPUTUM: "Describe the color of your sputum" (clear, white, yellow, green)     Greenish yellow 6. HEMOPTYSIS: "Are you coughing up any blood?" If so ask: "How much?" (flecks, streaks, tablespoons, etc.)     no 7. CARDIAC HISTORY: "Do you have any history of heart disease?" (e.g., heart attack, congestive heart failure)      No has HTN 8. LUNG HISTORY: "Do you have any history of lung disease?"  (e.g., pulmonary embolus, asthma, emphysema)     no 9. PE RISK FACTORS: "Do you have a history of blood clots?" (or: recent major surgery, recent prolonged travel, bedridden )     no 10.  OTHER SYMPTOMS: "Do you have any other symptoms?" (e.g., runny nose, wheezing, chest pain)       Runny nose, no 11. PREGNANCY: "Is there any chance you are pregnant?" "When was your last menstrual period?"       n/a 12. TRAVEL: "Have you traveled out of the country in the last month?" (e.g., travel history, exposures)       No-  Protocols used: COUGH - ACUTE PRODUCTIVE-A-AH

## 2018-03-14 ENCOUNTER — Ambulatory Visit (INDEPENDENT_AMBULATORY_CARE_PROVIDER_SITE_OTHER): Payer: Medicare Other | Admitting: Family Medicine

## 2018-03-14 ENCOUNTER — Other Ambulatory Visit: Payer: Self-pay

## 2018-03-14 ENCOUNTER — Encounter: Payer: Self-pay | Admitting: Family Medicine

## 2018-03-14 DIAGNOSIS — E039 Hypothyroidism, unspecified: Secondary | ICD-10-CM

## 2018-03-14 DIAGNOSIS — J069 Acute upper respiratory infection, unspecified: Secondary | ICD-10-CM | POA: Diagnosis not present

## 2018-03-14 DIAGNOSIS — B9789 Other viral agents as the cause of diseases classified elsewhere: Secondary | ICD-10-CM

## 2018-03-14 DIAGNOSIS — I10 Essential (primary) hypertension: Secondary | ICD-10-CM

## 2018-03-14 MED ORDER — LISINOPRIL 40 MG PO TABS: ORAL_TABLET | ORAL | 1 refills | Status: DC

## 2018-03-14 MED ORDER — METOPROLOL SUCCINATE ER 100 MG PO TB24: ORAL_TABLET | ORAL | 1 refills | Status: DC

## 2018-03-14 MED ORDER — AZITHROMYCIN 250 MG PO TABS: ORAL_TABLET | ORAL | 0 refills | Status: DC

## 2018-03-14 MED ORDER — LEVOTHYROXINE SODIUM 50 MCG PO TABS: 50.0000 ug | ORAL_TABLET | Freq: Every day | ORAL | 1 refills | Status: DC

## 2018-03-14 MED ORDER — AMLODIPINE BESYLATE 5 MG PO TABS: 5.0000 mg | ORAL_TABLET | Freq: Every day | ORAL | 1 refills | Status: DC

## 2018-03-14 NOTE — Assessment & Plan Note (Signed)
Has been ongoing for weeks now.  - try azithromycin  - counseled on supportive care - given indications to follow up.

## 2018-03-14 NOTE — Telephone Encounter (Signed)
Refills sent

## 2018-03-14 NOTE — Progress Notes (Signed)
Frances [name] - 72 y.o. female MRN [id number]  Date of birth: Dec 20, 1946  SUBJECTIVE:  Including CC & ROS.  Chief Complaint  Patient presents with  . Cough    Frances [name] is a 72 y.o. female that is presenting with a cough. Ongoing for one month. Admits to productive cough with mucous that is green in color.  Denies fevers or body aches. Denies shortness of breath. Has been around her grandchildren with similar symptoms. Worsen at night.    Review of Systems  Constitutional: Negative for fever.  HENT: Positive for congestion and sinus pressure.   Respiratory: Positive for cough.   Cardiovascular: Negative for chest pain.    HISTORY: Past Medical, Surgical, Social, and Family History Reviewed & Updated per EMR.   Pertinent Historical Findings include:  Past Medical History:  Diagnosis Date  . Arthritis   . Blood in stool   . Depression   . GERD (gastroesophageal reflux disease)   . Hypertension   . Thyroid disease   . UTI (urinary tract infection)     Past Surgical History:  Procedure Laterality Date  . HERNIA REPAIR      No Known Allergies  Family History  Problem Relation Age of Onset  . Arthritis Mother   . Alcohol abuse Father   . Arthritis Father   . Stroke Father   . Arthritis Maternal Grandmother   . Stroke Maternal Grandmother   . Arthritis Maternal Grandfather   . Arthritis Paternal Grandmother   . Arthritis Paternal Grandfather      Social History   Socioeconomic History  . Marital status: Divorced    Spouse name: Not on file  . Number of children: Not on file  . Years of education: Not on file  . Highest education level: Not on file  Occupational History  . Not on file  Social Needs  . Financial resource strain: Not on file  . Food insecurity:    Worry: Not on file    Inability: Not on file  . Transportation needs:    Medical: Not on file    Non-medical: Not on file  Tobacco Use  . Smoking status: Never Smoker  . Smokeless  tobacco: Never Used  Substance and Sexual Activity  . Alcohol use: No    Alcohol/week: 0.0 oz  . Drug use: No  . Sexual activity: Not on file  Lifestyle  . Physical activity:    Days per week: Not on file    Minutes per session: Not on file  . Stress: Not on file  Relationships  . Social connections:    Talks on phone: Not on file    Gets together: Not on file    Attends religious service: Not on file    Active member of club or organization: Not on file    Attends meetings of clubs or organizations: Not on file    Relationship status: Not on file  . Intimate partner violence:    Fear of current or ex partner: Not on file    Emotionally abused: Not on file    Physically abused: Not on file    Forced sexual activity: Not on file  Other Topics Concern  . Not on file  Social History Narrative  . Not on file     PHYSICAL EXAM:  VS: BP 130/81 (BP Location: Left Arm, Patient Position: Sitting, Cuff Size: Normal)   Pulse 64   Temp 98 F (36.7 C) (Oral)   Ht 5\' 3"  (1.6  m)   Wt 233 lb (105.7 kg)   SpO2 99%   BMI 41.27 kg/m  Physical Exam Gen: NAD, alert, cooperative with exam,  ENT: normal lips, normal nasal mucosa, tympanic membranes clear and intact bilaterally, normal oropharynx, no cervical lymphadenopathy Eye: normal EOM, normal conjunctiva and lids CV:  no edema, +2 pedal pulses, regular rate and rhythm, S1-S2   Resp: no accessory muscle use, non-labored, clear to auscultation bilaterally, no crackles or wheezes Skin: no rashes, no areas of induration  Neuro: normal tone, normal sensation to touch Psych:  normal insight, alert and oriented MSK: Normal gait, normal strength       ASSESSMENT & PLAN:   Viral URI with cough Has been ongoing for weeks now.  - try azithromycin  - counseled on supportive care - given indications to follow up.

## 2018-03-14 NOTE — Patient Instructions (Signed)
Please try things such as zyrtec-D or allegra-D which is an antihistamine and decongestant.   Please try afrin which will help with nasal congestion but use for only three days.   Please also try using a netti pot on a regular occasion.  Honey can help with a sore throat.   Vick's and Delsym can help with a cough.  

## 2018-03-14 NOTE — Telephone Encounter (Signed)
Called patient regarding her refills: amlodipine, lisinopril, metoprolol succinate and levothyroxine.   These meds were ordered, or changed under Conni Elliotharlotte Niche, NP .   Please review and advise on refilling these meds. Her pharmacy is : [facility] on [address], [place]  LOV  02/28/18 NOV  03/14/18 with Clare GandyJeremy Schmitz, MD

## 2018-09-08 ENCOUNTER — Other Ambulatory Visit: Payer: Self-pay | Admitting: Internal Medicine

## 2018-09-08 DIAGNOSIS — I10 Essential (primary) hypertension: Secondary | ICD-10-CM

## 2018-09-08 DIAGNOSIS — E039 Hypothyroidism, unspecified: Secondary | ICD-10-CM

## 2018-09-13 ENCOUNTER — Other Ambulatory Visit: Payer: Self-pay | Admitting: Internal Medicine

## 2018-09-13 DIAGNOSIS — E039 Hypothyroidism, unspecified: Secondary | ICD-10-CM

## 2018-09-13 DIAGNOSIS — I10 Essential (primary) hypertension: Secondary | ICD-10-CM

## 2018-09-13 NOTE — Telephone Encounter (Signed)
Patient has not seen crawford in two years and needs refills I see where they have her on laura's schedule on the 27th to get refills am not sure if she will be willing to fill before the appointment or not. Also not sure if it appropriate for patient to be on laura's schedule?? I can always send in a small supply if laura would rather patient make an appointment with Dr. Okey Dupre when she comes back that can get her till that appointment.

## 2018-09-14 NOTE — Telephone Encounter (Deleted)
Patient called and requested to speak to Lupita Leash or Anige ([employer] agents) advised patient that

## 2018-09-15 ENCOUNTER — Ambulatory Visit: Payer: Medicare Other | Admitting: Family

## 2018-09-25 ENCOUNTER — Ambulatory Visit (INDEPENDENT_AMBULATORY_CARE_PROVIDER_SITE_OTHER): Payer: Medicare Other | Admitting: Internal Medicine

## 2018-09-25 ENCOUNTER — Encounter: Payer: Self-pay | Admitting: Internal Medicine

## 2018-09-25 ENCOUNTER — Other Ambulatory Visit (INDEPENDENT_AMBULATORY_CARE_PROVIDER_SITE_OTHER): Payer: Medicare Other

## 2018-09-25 VITALS — BP 150/80 | HR 60 | Temp 98.0°F | Ht 63.0 in | Wt 227.0 lb

## 2018-09-25 DIAGNOSIS — Z1159 Encounter for screening for other viral diseases: Secondary | ICD-10-CM

## 2018-09-25 DIAGNOSIS — E039 Hypothyroidism, unspecified: Secondary | ICD-10-CM | POA: Diagnosis not present

## 2018-09-25 DIAGNOSIS — R739 Hyperglycemia, unspecified: Secondary | ICD-10-CM | POA: Diagnosis not present

## 2018-09-25 DIAGNOSIS — Z0000 Encounter for general adult medical examination without abnormal findings: Secondary | ICD-10-CM | POA: Diagnosis not present

## 2018-09-25 DIAGNOSIS — Z23 Encounter for immunization: Secondary | ICD-10-CM | POA: Diagnosis not present

## 2018-09-25 DIAGNOSIS — I10 Essential (primary) hypertension: Secondary | ICD-10-CM

## 2018-09-25 LAB — LIPID PANEL
CHOL/HDL RATIO: 4
Cholesterol: 145 mg/dL (ref 0–200)
HDL: 40.7 mg/dL (ref >39.00)
LDL CALC: 85 mg/dL (ref 0–99)
NONHDL: 104.04
Triglycerides: 95 mg/dL (ref 0.0–149.0)
VLDL: 19 mg/dL (ref 0.0–40.0)

## 2018-09-25 LAB — COMPREHENSIVE METABOLIC PANEL
ALT: 10 U/L (ref 0–35)
AST: 14 U/L (ref 0–37)
Albumin: 3.9 g/dL (ref 3.5–5.2)
Alkaline Phosphatase: 61 U/L (ref 39–117)
BUN: 14 mg/dL (ref 6–23)
CO2: 32 mEq/L (ref 19–32)
CREATININE: 1.04 mg/dL (ref 0.40–1.20)
Calcium: 9.6 mg/dL (ref 8.4–10.5)
Chloride: 107 mEq/L (ref 96–112)
GFR: 67.34 mL/min (ref >60.00)
GLUCOSE: 90 mg/dL (ref 70–99)
Potassium: 4.1 mEq/L (ref 3.5–5.1)
Sodium: 144 mEq/L (ref 135–145)
Total Bilirubin: 0.4 mg/dL (ref 0.2–1.2)
Total Protein: 7.3 g/dL (ref 6.0–8.3)

## 2018-09-25 LAB — CBC
HCT: 41.4 % (ref 36.0–46.0)
Hemoglobin: 13.9 g/dL (ref 12.0–15.0)
MCHC: 33.6 g/dL (ref 30.0–36.0)
MCV: 85.1 fl (ref 78.0–100.0)
Platelets: 303 10*3/uL (ref 150.0–400.0)
RBC: 4.86 Mil/uL (ref 3.87–5.11)
RDW: 15.1 % (ref 11.5–15.5)
WBC: 6.6 10*3/uL (ref 4.0–10.5)

## 2018-09-25 LAB — HEMOGLOBIN A1C: HEMOGLOBIN A1C: 5.3 % (ref 4.6–6.5)

## 2018-09-25 LAB — T4, FREE: FREE T4: 1.08 ng/dL (ref 0.60–1.60)

## 2018-09-25 LAB — TSH: TSH: 2 u[IU]/mL (ref 0.35–4.50)

## 2018-09-25 MED ORDER — LISINOPRIL 40 MG PO TABS: 40.0000 mg | ORAL_TABLET | Freq: Every day | ORAL | 3 refills | Status: DC

## 2018-09-25 MED ORDER — LEVOTHYROXINE SODIUM 50 MCG PO TABS: 50.0000 ug | ORAL_TABLET | Freq: Every day | ORAL | 3 refills | Status: DC

## 2018-09-25 MED ORDER — AMLODIPINE BESYLATE 5 MG PO TABS: 5.0000 mg | ORAL_TABLET | Freq: Every day | ORAL | 3 refills | Status: DC

## 2018-09-25 MED ORDER — METOPROLOL SUCCINATE ER 100 MG PO TB24: 100.0000 mg | ORAL_TABLET | Freq: Two times a day (BID) | ORAL | 3 refills | Status: DC

## 2018-09-25 NOTE — Assessment & Plan Note (Signed)
Flu shot up to date. Pneumonia up to date. Shingrix counseled. Tetanus given. Colonoscopy declined. Mammogram declined, pap smear not indicated and dexa declined. Counseled about sun safety and mole surveillance. Counseled about the dangers of distracted driving. Given 10 year screening recommendations. Hep C screening ordered.

## 2018-09-25 NOTE — Patient Instructions (Addendum)
We are checking the labs today. The EKG of the heart looks the same as before and there are no changes.    Stress and Stress Management Stress is a normal reaction to life events. It is what you feel when life demands more than you are used to or more than you can handle. Some stress can be useful. For example, the stress reaction can help you catch the last bus of the day, study for a test, or meet a deadline at work. But stress that occurs too often or for too long can cause problems. It can affect your emotional health and interfere with relationships and normal daily activities. Too much stress can weaken your immune system and increase your risk for physical illness. If you already have a medical problem, stress can make it worse. What are the causes? All sorts of life events may cause stress. An event that causes stress for one person may not be stressful for another person. Major life events commonly cause stress. These may be positive or negative. Examples include losing your job, moving into a new home, getting married, having a baby, or losing a loved one. Less obvious life events may also cause stress, especially if they occur day after day or in combination. Examples include working long hours, driving in traffic, caring for children, being in debt, or being in a difficult relationship. What are the signs or symptoms? Stress may cause emotional symptoms including, the following:  Anxiety. This is feeling worried, afraid, on edge, overwhelmed, or out of control.  Anger. This is feeling irritated or impatient.  Depression. This is feeling sad, down, helpless, or guilty.  Difficulty focusing, remembering, or making decisions.  Stress may cause physical symptoms, including the following:  Aches and pains. These may affect your head, neck, back, stomach, or other areas of your body.  Tight muscles or clenched jaw.  Low energy or trouble sleeping.  Stress may cause unhealthy  behaviors, including the following:  Eating to feel better (overeating) or skipping meals.  Sleeping too little, too much, or both.  Working too much or putting off tasks (procrastination).  Smoking, drinking alcohol, or using drugs to feel better.  How is this diagnosed? Stress is diagnosed through an assessment by your health care provider. Your health care provider will ask questions about your symptoms and any stressful life events.Your health care provider will also ask about your medical history and may order blood tests or other tests. Certain medical conditions and medicine can cause physical symptoms similar to stress. Mental illness can cause emotional symptoms and unhealthy behaviors similar to stress. Your health care provider may refer you to a mental health professional for further evaluation. How is this treated? Stress management is the recommended treatment for stress.The goals of stress management are reducing stressful life events and coping with stress in healthy ways. Techniques for reducing stressful life events include the following:  Stress identification. Self-monitor for stress and identify what causes stress for you. These skills may help you to avoid some stressful events.  Time management. Set your priorities, keep a calendar of events, and learn to say "no." These tools can help you avoid making too many commitments.  Techniques for coping with stress include the following:  Rethinking the problem. Try to think realistically about stressful events rather than ignoring them or overreacting. Try to find the positives in a stressful situation rather than focusing on the negatives.  Exercise. Physical exercise can release both physical and emotional  tension. The key is to find a form of exercise you enjoy and do it regularly.  Relaxation techniques. These relax the body and mind. Examples include yoga, meditation, tai chi, biofeedback, deep breathing, progressive  muscle relaxation, listening to music, being out in nature, journaling, and other hobbies. Again, the key is to find one or more that you enjoy and can do regularly.  Healthy lifestyle. Eat a balanced diet, get plenty of sleep, and do not smoke. Avoid using alcohol or drugs to relax.  Strong support network. Spend time with family, friends, or other people you enjoy being around.Express your feelings and talk things over with someone you trust.  Counseling or talktherapy with a mental health professional may be helpful if you are having difficulty managing stress on your own. Medicine is typically not recommended for the treatment of stress.Talk to your health care provider if you think you need medicine for symptoms of stress. Follow these instructions at home:  Keep all follow-up visits as directed by your health care provider.  Take all medicines as directed by your health care provider. Contact a health care provider if:  Your symptoms get worse or you start having new symptoms.  You feel overwhelmed by your problems and can no longer manage them on your own. Get help right away if:  You feel like hurting yourself or someone else. This information is not intended to replace advice given to you by your health care provider. Make sure you discuss any questions you have with your health care provider. Document Released: 06/01/2001 Document Revised: 05/13/2016 Document Reviewed: 07/31/2013 Elsevier Interactive Patient Education  2017 [place] Maintenance, Female Adopting a healthy lifestyle and getting preventive care can go a long way to promote health and wellness. Talk with your health care provider about what schedule of regular examinations is right for you. This is a good chance for you to check in with your provider about disease prevention and staying healthy. In between checkups, there are plenty of things you can do on your own. Experts have done a lot of  research about which lifestyle changes and preventive measures are most likely to keep you healthy. Ask your health care provider for more information. Weight and diet Eat a healthy diet  Be sure to include plenty of vegetables, fruits, low-fat dairy products, and lean protein.  Do not eat a lot of foods high in solid fats, added sugars, or salt.  Get regular exercise. This is one of the most important things you can do for your health. ? Most adults should exercise for at least 150 minutes each week. The exercise should increase your heart rate and make you sweat (moderate-intensity exercise). ? Most adults should also do strengthening exercises at least twice a week. This is in addition to the moderate-intensity exercise.  Maintain a healthy weight  Body mass index (BMI) is a measurement that can be used to identify possible weight problems. It estimates body fat based on height and weight. Your health care provider can help determine your BMI and help you achieve or maintain a healthy weight.  For females 26 years of age and older: ? A BMI below 18.5 is considered underweight. ? A BMI of 18.5 to 24.9 is normal. ? A BMI of 25 to 29.9 is considered overweight. ? A BMI of 30 and above is considered obese.  Watch levels of cholesterol and blood lipids  You should start having your blood tested for lipids and cholesterol  at 70 years of age, then have this test every 5 years.  You may need to have your cholesterol levels checked more often if: ? Your lipid or cholesterol levels are high. ? You are older than 70 years of age. ? You are at high risk for heart disease.  Cancer screening Lung Cancer  Lung cancer screening is recommended for adults 45-58 years old who are at high risk for lung cancer because of a history of smoking.  A yearly low-dose CT scan of the lungs is recommended for people who: ? Currently smoke. ? Have quit within the past 15 years. ? Have at least a  30-pack-year history of smoking. A pack year is smoking an average of one pack of cigarettes a day for 1 year.  Yearly screening should continue until it has been 15 years since you quit.  Yearly screening should stop if you develop a health problem that would prevent you from having lung cancer treatment.  Breast Cancer  Practice breast self-awareness. This means understanding how your breasts normally appear and feel.  It also means doing regular breast self-exams. Let your health care provider know about any changes, no matter how small.  If you are in your 20s or 30s, you should have a clinical breast exam (CBE) by a health care provider every 1-3 years as part of a regular health exam.  If you are 60 or older, have a CBE every year. Also consider having a breast X-ray (mammogram) every year.  If you have a family history of breast cancer, talk to your health care provider about genetic screening.  If you are at high risk for breast cancer, talk to your health care provider about having an MRI and a mammogram every year.  Breast cancer gene (BRCA) assessment is recommended for women who have family members with BRCA-related cancers. BRCA-related cancers include: ? Breast. ? Ovarian. ? Tubal. ? Peritoneal cancers.  Results of the assessment will determine the need for genetic counseling and BRCA1 and BRCA2 testing.  Cervical Cancer Your health care provider may recommend that you be screened regularly for cancer of the pelvic organs (ovaries, uterus, and vagina). This screening involves a pelvic examination, including checking for microscopic changes to the surface of your cervix (Pap test). You may be encouraged to have this screening done every 3 years, beginning at age 52.  For women ages 16-65, health care providers may recommend pelvic exams and Pap testing every 3 years, or they may recommend the Pap and pelvic exam, combined with testing for human papilloma virus (HPV), every  5 years. Some types of HPV increase your risk of cervical cancer. Testing for HPV may also be done on women of any age with unclear Pap test results.  Other health care providers may not recommend any screening for nonpregnant women who are considered low risk for pelvic cancer and who do not have symptoms. Ask your health care provider if a screening pelvic exam is right for you.  If you have had past treatment for cervical cancer or a condition that could lead to cancer, you need Pap tests and screening for cancer for at least 20 years after your treatment. If Pap tests have been discontinued, your risk factors (such as having a new sexual partner) need to be reassessed to determine if screening should resume. Some women have medical problems that increase the chance of getting cervical cancer. In these cases, your health care provider may recommend more frequent screening and  Pap tests.  Colorectal Cancer  This type of cancer can be detected and often prevented.  Routine colorectal cancer screening usually begins at 70 years of age and continues through 70 years of age.  Your health care provider may recommend screening at an earlier age if you have risk factors for colon cancer.  Your health care provider may also recommend using home test kits to check for hidden blood in the stool.  A small camera at the end of a tube can be used to examine your colon directly (sigmoidoscopy or colonoscopy). This is done to check for the earliest forms of colorectal cancer.  Routine screening usually begins at age 49.  Direct examination of the colon should be repeated every 5-10 years through 70 years of age. However, you may need to be screened more often if early forms of precancerous polyps or small growths are found.  Skin Cancer  Check your skin from head to toe regularly.  Tell your health care provider about any new moles or changes in moles, especially if there is a change in a mole's shape  or color.  Also tell your health care provider if you have a mole that is larger than the size of a pencil eraser.  Always use sunscreen. Apply sunscreen liberally and repeatedly throughout the day.  Protect yourself by wearing long sleeves, pants, a wide-brimmed hat, and sunglasses whenever you are outside.  Heart disease, diabetes, and high blood pressure  High blood pressure causes heart disease and increases the risk of stroke. High blood pressure is more likely to develop in: ? People who have blood pressure in the high end of the normal range (130-139/85-89 mm Hg). ? People who are overweight or obese. ? People who are African American.  If you are 73-51 years of age, have your blood pressure checked every 3-5 years. If you are 80 years of age or older, have your blood pressure checked every year. You should have your blood pressure measured twice-once when you are at a hospital or clinic, and once when you are not at a hospital or clinic. Record the average of the two measurements. To check your blood pressure when you are not at a hospital or clinic, you can use: ? An automated blood pressure machine at a pharmacy. ? A home blood pressure monitor.  If you are between 79 years and 81 years old, ask your health care provider if you should take aspirin to prevent strokes.  Have regular diabetes screenings. This involves taking a blood sample to check your fasting blood sugar level. ? If you are at a normal weight and have a low risk for diabetes, have this test once every three years after 70 years of age. ? If you are overweight and have a high risk for diabetes, consider being tested at a younger age or more often. Preventing infection Hepatitis B  If you have a higher risk for hepatitis B, you should be screened for this virus. You are considered at high risk for hepatitis B if: ? You were born in a country where hepatitis B is common. Ask your health care provider which countries  are considered high risk. ? Your parents were born in a high-risk country, and you have not been immunized against hepatitis B (hepatitis B vaccine). ? You have HIV or AIDS. ? You use needles to inject street drugs. ? You live with someone who has hepatitis B. ? You have had sex with someone who has  hepatitis B. ? You get hemodialysis treatment. ? You take certain medicines for conditions, including cancer, organ transplantation, and autoimmune conditions.  Hepatitis C  Blood testing is recommended for: ? Everyone born from [birth year] through 1963. ? Anyone with known risk factors for hepatitis C.  Sexually transmitted infections (STIs)  You should be screened for sexually transmitted infections (STIs) including gonorrhea and chlamydia if: ? You are sexually active and are younger than 70 years of age. ? You are older than 70 years of age and your health care provider tells you that you are at risk for this type of infection. ? Your sexual activity has changed since you were last screened and you are at an increased risk for chlamydia or gonorrhea. Ask your health care provider if you are at risk.  If you do not have HIV, but are at risk, it may be recommended that you take a prescription medicine daily to prevent HIV infection. This is called pre-exposure prophylaxis (PrEP). You are considered at risk if: ? You are sexually active and do not regularly use condoms or know the HIV status of your partner(s). ? You take drugs by injection. ? You are sexually active with a partner who has HIV.  Talk with your health care provider about whether you are at high risk of being infected with HIV. If you choose to begin PrEP, you should first be tested for HIV. You should then be tested every 3 months for as long as you are taking PrEP. Pregnancy  If you are premenopausal and you may become pregnant, ask your health care provider about preconception counseling.  If you may become pregnant, take 400  to 800 micrograms (mcg) of folic acid every day.  If you want to prevent pregnancy, talk to your health care provider about birth control (contraception). Osteoporosis and menopause  Osteoporosis is a disease in which the bones lose minerals and strength with aging. This can result in serious bone fractures. Your risk for osteoporosis can be identified using a bone density scan.  If you are 62 years of age or older, or if you are at risk for osteoporosis and fractures, ask your health care provider if you should be screened.  Ask your health care provider whether you should take a calcium or vitamin D supplement to lower your risk for osteoporosis.  Menopause may have certain physical symptoms and risks.  Hormone replacement therapy may reduce some of these symptoms and risks. Talk to your health care provider about whether hormone replacement therapy is right for you. Follow these instructions at home:  Schedule regular health, dental, and eye exams.  Stay current with your immunizations.  Do not use any tobacco products including cigarettes, chewing tobacco, or electronic cigarettes.  If you are pregnant, do not drink alcohol.  If you are breastfeeding, limit how much and how often you drink alcohol.  Limit alcohol intake to no more than 1 drink per day for nonpregnant women. One drink equals 12 ounces of beer, 5 ounces of wine, or 1 ounces of hard liquor.  Do not use street drugs.  Do not share needles.  Ask your health care provider for help if you need support or information about quitting drugs.  Tell your health care provider if you often feel depressed.  Tell your health care provider if you have ever been abused or do not feel safe at home. This information is not intended to replace advice given to you by your health care provider.  Make sure you discuss any questions you have with your health care provider. Document Released: 06/21/2011 Document Revised: 05/13/2016  Document Reviewed: 09/09/2015 Elsevier Interactive Patient Education  [employer].

## 2018-09-25 NOTE — Assessment & Plan Note (Signed)
Checking HgA1c and adjust as needed. Currently no meds.

## 2018-09-25 NOTE — Progress Notes (Signed)
   Subjective:    Patient ID: Frances [name], female    DOB: 23-Jun-1946, 72 y.o.   MRN: [id number]  HPI Here for medicare wellness and physical, no new complaints. Please see A/P for status and treatment of chronic medical problems.   Diet: heart healthy Physical activity: sedentary Depression/mood screen: negative Hearing: intact to whispered voice Visual acuity: grossly normal, performs annual eye exam  ADLs: capable Fall risk: none Home safety: good Cognitive evaluation: intact to orientation, naming, recall and repetition EOL planning: adv directives discussed  I have personally reviewed and have noted 1. The patient's medical and social history - reviewed today no changes 2. Their use of alcohol, tobacco or illicit drugs 3. Their current medications and supplements 4. The patient's functional ability including ADL's, fall risks, home safety risks and hearing or visual impairment. 5. Diet and physical activities 6. Evidence for depression or mood disorders 7. Care team reviewed and updated (available in snapshot)  Review of Systems  Constitutional: Negative.   HENT: Negative.   Eyes: Negative.   Respiratory: Negative for cough, chest tightness and shortness of breath.   Cardiovascular: Negative for chest pain, palpitations and leg swelling.  Gastrointestinal: Negative for abdominal distention, abdominal pain, constipation, diarrhea, nausea and vomiting.  Musculoskeletal: Negative.   Skin: Negative.   Neurological: Negative.   Psychiatric/Behavioral: Negative.       Objective:   Physical Exam  Constitutional: She is oriented to person, place, and time. She appears well-developed and well-nourished.  HENT:  Head: Normocephalic and atraumatic.  Eyes: EOM are normal.  Neck: Normal range of motion.  Cardiovascular: Normal rate and regular rhythm.  Pulmonary/Chest: Effort normal and breath sounds normal. No respiratory distress. She has no wheezes. She has no rales.   Abdominal: Soft. Bowel sounds are normal. She exhibits no distension. There is no tenderness. There is no rebound.  Musculoskeletal: She exhibits no edema.  Neurological: She is alert and oriented to person, place, and time. Coordination normal.  Skin: Skin is warm and dry.  Psychiatric: She has a normal mood and affect.   Vitals:   09/25/18 0935  BP: (!) 150/80  Pulse: 60  Temp: 98 F (36.7 C)  TempSrc: Oral  SpO2: 99%  Weight: 227 lb (103 kg)  Height: 5\' 3"  (1.6 m)   EKG: Rate 55, axis normal, interval normal, sinus, no st or t wave changes. No prior.     Assessment & Plan:  Tdap given at visit

## 2018-09-25 NOTE — Assessment & Plan Note (Signed)
EKG done today without worrisome changes. Taking metoprolol, amlodipine, lisinopril and BP at goal. Checking CMP and adjust as needed. HR okay.

## 2018-09-25 NOTE — Assessment & Plan Note (Signed)
Checking TSH and free T4 and adjust synthroid 50 mcg daily as needed.  

## 2018-09-26 ENCOUNTER — Telehealth: Payer: Self-pay

## 2018-09-26 LAB — HEPATITIS C ANTIBODY
HEP C AB: NONREACTIVE
SIGNAL TO CUT-OFF: 0.04 (ref <1.00)

## 2018-09-26 NOTE — Telephone Encounter (Signed)
Patient informed and stated understanding.

## 2018-09-26 NOTE — Telephone Encounter (Signed)
Based on current risk she would be okay to stop aspirin. Prevagen is okay to take if she wants.

## 2018-09-26 NOTE — Telephone Encounter (Signed)
Patient is concerned about her baby aspirin she is taking. States that she read over the age of 80 that it is more harmful than good but then has also heard that if she does not take it she could get a clot. Should patient stay on baby aspirin.   patient has been having memory issues and was wondering if she could take OTC prevagen with her current medications she is taking

## 2018-10-02 DIAGNOSIS — H04123 Dry eye syndrome of bilateral lacrimal glands: Secondary | ICD-10-CM | POA: Diagnosis not present

## 2018-10-02 DIAGNOSIS — H5203 Hypermetropia, bilateral: Secondary | ICD-10-CM | POA: Diagnosis not present

## 2018-10-02 DIAGNOSIS — H524 Presbyopia: Secondary | ICD-10-CM | POA: Diagnosis not present

## 2019-01-03 ENCOUNTER — Ambulatory Visit: Payer: Self-pay

## 2019-01-03 NOTE — Telephone Encounter (Signed)
Patient called in for abdominal pain and hung up before the call could be transferred to NT. I called the patient back and she says "I just want an appointment." I advised I can schedule the appointment after the questions, she says "I don't want to answer any questions, just schedule me an appointment. I was told Dr. Okey Dupre didn't have any appointments. I need to see a GI doctor." I advised she will need to be evaluated in the office before a referral as most insurances required this. She says "can I just go to an UC?" I advised she can go to UC or ED, it's her choice what she wants to do, but there's no guarantee that provider will make the referral to GI, they may advised to see PCP for referral. She says "give me the names of the UC in the network, please." I advised [facility] on [address] and on [place]. I advised there are openings at the office with Roddie Mc, PA on tomorrow, she says "what time are you closing? I'll call back after I think about what to do." I advised we are here until 7 pm taking calls and advised appointments fill up fast, so it may not be an opening when she calls back, she says "ok, thank you."

## 2019-01-05 ENCOUNTER — Encounter (INDEPENDENT_AMBULATORY_CARE_PROVIDER_SITE_OTHER): Payer: Self-pay

## 2019-01-05 ENCOUNTER — Other Ambulatory Visit (INDEPENDENT_AMBULATORY_CARE_PROVIDER_SITE_OTHER): Payer: Medicare Other

## 2019-01-05 ENCOUNTER — Encounter: Payer: Self-pay | Admitting: Nurse Practitioner

## 2019-01-05 ENCOUNTER — Ambulatory Visit: Payer: Medicare Other | Admitting: Nurse Practitioner

## 2019-01-05 VITALS — BP 128/80 | HR 60 | Ht 63.0 in | Wt 220.0 lb

## 2019-01-05 DIAGNOSIS — K5909 Other constipation: Secondary | ICD-10-CM

## 2019-01-05 DIAGNOSIS — R1084 Generalized abdominal pain: Secondary | ICD-10-CM

## 2019-01-05 DIAGNOSIS — R634 Abnormal weight loss: Secondary | ICD-10-CM

## 2019-01-05 LAB — COMPREHENSIVE METABOLIC PANEL
ALK PHOS: 53 U/L (ref 39–117)
ALT: 12 U/L (ref 0–35)
AST: 14 U/L (ref 0–37)
Albumin: 4 g/dL (ref 3.5–5.2)
BUN: 14 mg/dL (ref 6–23)
CALCIUM: 9.5 mg/dL (ref 8.4–10.5)
CO2: 29 mEq/L (ref 19–32)
Chloride: 104 mEq/L (ref 96–112)
Creatinine, Ser: 1.09 mg/dL (ref 0.40–1.20)
GFR: 59.97 mL/min — ABNORMAL LOW (ref >60.00)
Glucose, Bld: 81 mg/dL (ref 70–99)
Potassium: 3.8 mEq/L (ref 3.5–5.1)
Sodium: 140 mEq/L (ref 135–145)
Total Bilirubin: 0.6 mg/dL (ref 0.2–1.2)
Total Protein: 7.2 g/dL (ref 6.0–8.3)

## 2019-01-05 LAB — CBC
HCT: 42.2 % (ref 36.0–46.0)
Hemoglobin: 14.3 g/dL (ref 12.0–15.0)
MCHC: 33.9 g/dL (ref 30.0–36.0)
MCV: 85.6 fl (ref 78.0–100.0)
Platelets: 383 10*3/uL (ref 150.0–400.0)
RBC: 4.93 Mil/uL (ref 3.87–5.11)
RDW: 15.4 % (ref 11.5–15.5)
WBC: 7.8 10*3/uL (ref 4.0–10.5)

## 2019-01-05 LAB — LIPASE: Lipase: 6 U/L — ABNORMAL LOW (ref 11.0–59.0)

## 2019-01-05 MED ORDER — DICYCLOMINE HCL 10 MG PO CAPS: ORAL_CAPSULE | ORAL | 2 refills | Status: DC

## 2019-01-05 NOTE — Progress Notes (Signed)
ASSESSMENT / PLAN:   73 year old female MULTIPLE GI complaints including worsening of chronic abdominal symptoms of intestinal gas, gurgling, bloating, constipation in addition to recent development of severe, migrating "spasm" type abdominal pain She has unintentionally lost 17 pounds over the last year which may or may not be related. Some of her symptoms sound like IBS, especially since she feels great today after having a good bowel movement. The weight loss, new abdominal pain and abnormal abdominal exam need further workup however -update labs to include CBC, lipase and Cmet -Patient has never had a colonoscopy, I think we should start with that especially given the weight loss.  She did have rectal bleeding a year ago, possibly hemorrhoidal. The risks and benefits of colonoscopy with possible polypectomy were discussed and the patient agrees to proceed.  -For spasm type pain we will try Bentyl 10 mg twice daily.  If no improvement after a week, advised to stop med. Not worth continuing if no improvement, especially given potential to exacerbate constipation -Start daily fiber.  She will try Benefiber or Citrucel -I have asked her to take MiraLAX on a daily basis.  Daughter says it sometimes does not work for her.  Hopefully the fiber will help but if not she can increase MiraLAX to twice a day as needed. -On exam there is a fullness /mass like area in lower aspect of LUQ.  Area not particularly tender, possibly feeling stool in the colon but needs to be reassessed once bowels are moving well.  If still present then recommend abdominal imaging and I am happy to see her for this in clinic following colonoscopy   HPI:    Chief Complaint:   Self-referred. Multiple GI complaints  Patient is a 73 year old female here with multiple GI complaints.  She complains of intestinal gurgling, migrating abdominal spasm type pain, bloating, gas, chronic constipation, unintentional weight  loss.   Most of her symptoms are chronic (years) but have recently become much more frequent and severe and the spasm type pain is new.   Feels excessive amount of gas in intestines. Gas-x and Miralax usually help but not lately except for today she had a "good" BM and feels the best she has felt in a long time. Generally takes Miralax ~ once a week. She always strains to have a bowel movement even when stools are not hard.   Patient here at daughter's urging.  Daughter concerned as she has witnessed mother's recent episodes of severe abdominal pain.  The abdominal pain migrates around the abdomen but is mainly periumbilical, sometimes in LUQ and across lower abdomen.  In the lower abdomen she frequently has pin prick type of pain but the severe pain comes from what feels like spasms. Episodes of pain of severe, random, not related to eating.  Patient has been taking MiraLAX but usually no more than once a week.    A year or so ago she was having some rectal bleeding but told by provider it was probably hemorrhoidal.  Patient has never had a colonoscopy.  No family history of colon cancer  Patient has been reading about various conditions. She is concerned about gallstones, pancreatic problems, leaky gut. She had abdominal hernia repair and wonders if the material used for repair has become disrupting and is causing some of her symptoms.    Data Reviewed:  Most recent labs 09/25/2018 CMET normal. CBC normal.  Past Medical History:  Diagnosis Date  . Arthritis   . Blood in stool   . Depression   . GERD (gastroesophageal reflux disease)   . Hypertension   . Thyroid disease   . UTI (urinary tract infection)      Past Surgical History:  Procedure Laterality Date  . HERNIA REPAIR     Family History  Problem Relation Age of Onset  . Arthritis Mother   . Alcohol abuse Father   . Arthritis Father   . Stroke Father   . Arthritis Maternal Grandmother   . Stroke Maternal Grandmother   .  Arthritis Maternal Grandfather   . Arthritis Paternal Grandmother   . Arthritis Paternal Grandfather    Social History   Tobacco Use  . Smoking status: Never Smoker  . Smokeless tobacco: Never Used  Substance Use Topics  . Alcohol use: No    Alcohol/week: 0.0 standard drinks  . Drug use: No   Current Outpatient Medications  Medication Sig Dispense Refill  . amLODipine (NORVASC) 5 MG tablet Take 1 tablet (5 mg total) by mouth daily. 90 tablet 3  . colchicine 0.6 MG tablet Take 1 tablet (0.6 mg total) by mouth 2 (two) times daily between meals as needed. 10 tablet 0  . levothyroxine (SYNTHROID, LEVOTHROID) 50 MCG tablet Take 1 tablet (50 mcg total) by mouth daily before breakfast. 90 tablet 3  . lisinopril (PRINIVIL,ZESTRIL) 40 MG tablet Take 1 tablet (40 mg total) by mouth daily. 90 tablet 3  . metoprolol succinate (TOPROL-XL) 100 MG 24 hr tablet Take 1 tablet (100 mg total) by mouth 2 (two) times daily. TAKE 1 TABLET BY MOUTH TWICE A DAY 180 tablet 3  . Multiple Vitamin (MULTIVITAMINS PO) Take by mouth.    . polyethylene glycol (MIRALAX / GLYCOLAX) packet Take 17 g by mouth daily as needed.    . dicyclomine (BENTYL) 10 MG capsule Take 1 Tablet by mouth twice daily. 60 capsule 2   No current facility-administered medications for this visit.    No Known Allergies   Review of Systems: All systems reviewed and negative except where noted in HPI.   Creatinine clearance cannot be calculated (Patient's most recent lab result is older than the maximum 21 days allowed.)   Physical Exam:    Wt Readings from Last 3 Encounters:  01/05/19 220 lb (99.8 kg)  09/25/18 227 lb (103 kg)  03/14/18 233 lb (105.7 kg)    Ht 5\' 3"  (1.6 m)   Wt 220 lb (99.8 kg)   BMI 38.97 kg/m  Constitutional:  Pleasant female in no acute distress. Psychiatric: Normal mood and affect. Behavior is normal. EENT: Pupils normal.  Conjunctivae are normal. No scleral icterus. Neck supple.  Cardiovascular:  Normal rate, regular rhythm. No edema Pulmonary/chest: Effort normal and breath sounds normal. No wheezing, rales or rhonchi. Abdominal: Soft, nondistended, nontender. Bowel sounds active throughout. There are no masses palpable. No hepatomegaly. Neurological: Alert and oriented to person place and time. Skin: Skin is warm and dry. No rashes noted.  Willette Cluster, NP  01/05/2019, 9:28 AM   Myrlene Broker, *

## 2019-01-05 NOTE — Patient Instructions (Addendum)
Your provider has requested that you go to the basement level for lab work before leaving today. Press "B" on the elevator. The lab is located at the first door on the left as you exit the elevator. Take Miralax 17 grams in 8 oz of water 1-2 doses every day.  Take Benefiber or Citrucel fiber daily.  We sent a prescription for  Bentyl ( dicyclomine) 10 mg to [facility] [address]  Give it a week, if the Bentyl does not help, discontinue.   You have been scheduled for a colonoscopy. Please follow written instructions given to you at your visit today.  Please pick up your prep supplies at the pharmacy within the next 1-3 days. If you use inhalers (even only as needed), please bring them with you on the day of your procedure. Normal BMI (Body Mass Index- based on height and weight) is between 23 and 30. Your BMI today is Body mass index is 38.97 kg/m. Marland Kitchen. Please consider follow up  regarding your BMI with your Primary Care Provider.

## 2019-01-05 NOTE — Progress Notes (Signed)
Assessment and plan reviewed 

## 2019-01-11 ENCOUNTER — Telehealth: Payer: Self-pay | Admitting: Physician Assistant

## 2019-01-11 NOTE — Telephone Encounter (Signed)
Pt returning call for results she stated that she had questions.

## 2019-01-12 NOTE — Telephone Encounter (Signed)
Left message on machine to call back  

## 2019-01-12 NOTE — Telephone Encounter (Signed)
The pt wanted to know what labs were drawn.  We discussed the labs and all questions answered.

## 2019-01-18 ENCOUNTER — Encounter: Payer: Self-pay | Admitting: Internal Medicine

## 2019-01-24 ENCOUNTER — Telehealth: Payer: Self-pay | Admitting: Internal Medicine

## 2019-01-24 NOTE — Telephone Encounter (Signed)
Lm on vm 

## 2019-01-24 NOTE — Telephone Encounter (Signed)
Pt said she is returning your call [phone]

## 2019-01-24 NOTE — Telephone Encounter (Signed)
Pt has some questions about upcoming colon pt wanted to speak with nurse

## 2019-01-25 NOTE — Telephone Encounter (Signed)
Spoke with patient who asked numerous questions about her upcoming colonoscopy including how it was coded, what she could eat five days prior, what color jello she could have and whether she could take her Benefiber before the procedure.  I answered each question to her satisfaction.  We reviewed the prepping and discussed what happens when the procedure. Is finished.  I encouraged patient to call back if she had any other questions and she agreed.

## 2019-01-29 ENCOUNTER — Telehealth: Payer: Self-pay | Admitting: Internal Medicine

## 2019-01-29 NOTE — Telephone Encounter (Signed)
Copied from CRM [id number]. Topic: Quick Communication - See Telephone Encounter >> Jan 29, 2019  5:12 PM Jens Som A wrote: CRM for notification. See Telephone encounter for: 01/29/19.  Patient is calling regarding billing. Services rendered 09/25/18- TDAP vaccine? What is that? Administered shingle 1 ? Electrocardiogram? Please advise [phone],

## 2019-01-30 ENCOUNTER — Encounter: Payer: Self-pay | Admitting: *Deleted

## 2019-01-30 NOTE — Telephone Encounter (Signed)
Routing to coding pool

## 2019-01-31 NOTE — Telephone Encounter (Signed)
Sent to [employer] for her team to call the patient about billing.

## 2019-02-01 ENCOUNTER — Ambulatory Visit (AMBULATORY_SURGERY_CENTER): Payer: Medicare Other | Admitting: Internal Medicine

## 2019-02-01 ENCOUNTER — Telehealth: Payer: Self-pay | Admitting: Internal Medicine

## 2019-02-01 ENCOUNTER — Encounter: Payer: Self-pay | Admitting: Internal Medicine

## 2019-02-01 VITALS — BP 126/68 | HR 62 | Temp 97.8°F | Resp 15 | Ht 63.0 in | Wt 220.0 lb

## 2019-02-01 DIAGNOSIS — R634 Abnormal weight loss: Secondary | ICD-10-CM

## 2019-02-01 DIAGNOSIS — R1084 Generalized abdominal pain: Secondary | ICD-10-CM | POA: Diagnosis not present

## 2019-02-01 DIAGNOSIS — K5909 Other constipation: Secondary | ICD-10-CM

## 2019-02-01 DIAGNOSIS — K5730 Diverticulosis of large intestine without perforation or abscess without bleeding: Secondary | ICD-10-CM

## 2019-02-01 DIAGNOSIS — Z1211 Encounter for screening for malignant neoplasm of colon: Secondary | ICD-10-CM

## 2019-02-01 DIAGNOSIS — K625 Hemorrhage of anus and rectum: Secondary | ICD-10-CM

## 2019-02-01 MED ORDER — SODIUM CHLORIDE 0.9 % IV SOLN: 500.0000 mL | Freq: Once | INTRAVENOUS | Status: DC

## 2019-02-01 NOTE — Progress Notes (Signed)
Report to PACU, RN, vss, BBS= Clear.  

## 2019-02-01 NOTE — Telephone Encounter (Signed)
Called and spoke with patient-patient reports she wanted to have the virtual colonoscopy today so that "I don't have to go back through that awful prep like I did for the colonoscopy that was today"; informed patient that a VCE would need to be cleared by insurance and that the prep used for that procedure was not the same as the prep for the colonoscopy she just had completed today; Patient informed she qould be contacted when notification was received to be able to schedule the VCE and pre instructions would be mailed to her when that appt was made; Patient verbalized understanding of information/instructions; Patient was advised to call back if questions/concerns arise;  Please advise =Would you like for me to contact this patient and have her VCE (per procedure report) scheduled and if so, how soon would you like this appt to be scheduled?

## 2019-02-01 NOTE — Op Note (Addendum)
[facility] Patient Name: Frances LindauBrenda Gaspari Procedure Date: 02/01/2019 11:22 AM MRN: [id number] Endoscopist: Wilhemina BonitoJohn N. Marina GoodellPerry , MD Age: [age] Referring MD:  Date of Birth: 1946-10-07 Gender: Female Account #: [id number] Procedure:                Colonoscopy Indications:              Colon cancer screening. Other complaints include                            abdominal pain, Rectal bleeding (last year), Weight                            loss. Recently seen in the office by advanced                            practitioner Medicines:                Monitored Anesthesia Care Procedure:                Pre-Anesthesia Assessment:                           - Prior to the procedure, a History and Physical                            was performed, and patient medications and                            allergies were reviewed. The patient's tolerance of                            previous anesthesia was also reviewed. The risks                            and benefits of the procedure and the sedation                            options and risks were discussed with the patient.                            All questions were answered, and informed consent                            was obtained. Prior Anticoagulants: The patient has                            taken no previous anticoagulant or antiplatelet                            agents. ASA Grade Assessment: II - A patient with                            mild systemic disease. After reviewing the risks  and benefits, the patient was deemed in                            satisfactory condition to undergo the procedure.                           After obtaining informed consent, the colonoscope                            was passed under direct vision. Throughout the                            procedure, the patient's blood pressure, pulse, and                            oxygen saturations were monitored continuously. The                             Model CF-HQ190L [id number](SN#[id number]) scope was introduced                            through the anus and advanced to the the cecum,                            identified by appendiceal orifice and ileocecal                            valve. The rectum was photographed. The quality of                            the bowel preparation was excellent. The                            colonoscopy was performed without difficulty. The                            patient tolerated the procedure well. The bowel                            preparation used was SUPREP. Scope In: 11:41:23 AM Scope Out: 12:01:26 PM Total Procedure Duration: 0 hours 20 minutes 3 seconds  Findings:                 Multiple diverticula were found in the sigmoid                            colon.                           There was marked torsion in the colon in the region                            of the splenic flexure which would not allow  passage of the colonoscope despite multiple                            maneuvers and altering patient position. The entire                            examined colon appeared otherwise normal on direct                            and retroflexion views. Complications:            No immediate complications. Estimated blood loss:                            None. Estimated Blood Loss:     Estimated blood loss: none. Impression:               1. Torsion in the proximal left colon with                            resultant incomplete exam                           2. Left-sided diverticulosis                           3. Chronic constipation abdominal pain. Recommendation:           1. Continue MiraLAX regularly to help with                            constipation. Adjust as needed                           2. Schedule virtual colonoscopy, incomplete                            colonoscopy, obstruction, abdominal pain and weight                             loss"                           3. Further recommendations, if any after after the                            above completed. Wilhemina Bonito. Marina Goodell, MD 02/01/2019 12:08:19 PM This report has been signed electronically.

## 2019-02-01 NOTE — Patient Instructions (Signed)
YOU HAD AN ENDOSCOPIC PROCEDURE TODAY AT THE [facility]:   Refer to the procedure report that was given to you for any specific questions about what was found during the examination.  If the procedure report does not answer your questions, please call your gastroenterologist to clarify.  If you requested that your care partner not be given the details of your procedure findings, then the procedure report has been included in a sealed envelope for you to review at your convenience later.  YOU SHOULD EXPECT: Some feelings of bloating in the abdomen. Passage of more gas than usual.  Walking can help get rid of the air that was put into your GI tract during the procedure and reduce the bloating. If you had a lower endoscopy (such as a colonoscopy or flexible sigmoidoscopy) you may notice spotting of blood in your stool or on the toilet paper. If you underwent a bowel prep for your procedure, you may not have a normal bowel movement for a few days.  Please Note:  You might notice some irritation and congestion in your nose or some drainage.  This is from the oxygen used during your procedure.  There is no need for concern and it should clear up in a day or so.  SYMPTOMS TO REPORT IMMEDIATELY:   Following lower endoscopy (colonoscopy or flexible sigmoidoscopy):  Excessive amounts of blood in the stool  Significant tenderness or worsening of abdominal pains  Swelling of the abdomen that is new, acute  Fever of 100F or higher   For urgent or emergent issues, a gastroenterologist can be reached at any hour by calling [phone].   DIET:  We do recommend a small meal at first, but then you may proceed to your regular diet.  Drink plenty of fluids but you should avoid alcoholic beverages for 24 hours.  MEDICATIONS: Continue present medications. Continue MiraLAX regularly to help with constipation. Adjust as needed.  Follow up: Schedule virtual colonoscopy - " incomplete colonoscopy,  obstruction, abdominal pain and weight loss". Dr. Lamar Sprinkles office nurse will call you to schedule this appointment.  Dr. Marina Goodell will have further recommendations, if any, after the above is completed.  Please see handouts given to you by your recovery nurse.  ACTIVITY:  You should plan to take it easy for the rest of today and you should NOT DRIVE or use heavy machinery until tomorrow (because of the sedation medicines used during the test).    FOLLOW UP: Our staff will call the number listed on your records the next business day following your procedure to check on you and address any questions or concerns that you may have regarding the information given to you following your procedure. If we do not reach you, we will leave a message.  However, if you are feeling well and you are not experiencing any problems, there is no need to return our call.  We will assume that you have returned to your regular daily activities without incident.  If any biopsies were taken you will be contacted by phone or by letter within the next 1-3 weeks.  Please call us at [phone] if you have not heard about the biopsies in 3 weeks.   Thank you for allowing [place] to provide for your healthcare needs today.  SIGNATURES/CONFIDENTIALITY: You and/or your care partner have signed paperwork which will be entered into your electronic medical record.  These signatures attest to the fact that that the information above on your After Visit  Summary has been reviewed and is understood.  Full responsibility of the confidentiality of this discharge information lies with you and/or your care-partner. 

## 2019-02-02 ENCOUNTER — Telehealth: Payer: Self-pay | Admitting: *Deleted

## 2019-02-02 NOTE — Telephone Encounter (Signed)
  Follow up Call-  Call back number 02/01/2019  Post procedure Call Back phone  # [phone]  Permission to leave phone message Yes  Some recent data might be hidden     Patient questions:  Do you have a fever, pain , or abdominal swelling? No. Pain Score  0 *  Have you tolerated food without any problems? Yes.    Have you been able to return to your normal activities? Yes.    Do you have any questions about your discharge instructions: Diet   No. Medications  No. Follow up visit  No.  Do you have questions or concerns about your Care? No.  Actions: * If pain score is 4 or above: No action needed, pain <4.

## 2019-02-02 NOTE — Telephone Encounter (Signed)
Yes, schedule Virtual colonoscopy ASAP for reasons listed on my colonoscopy report. Thanks

## 2019-02-02 NOTE — Telephone Encounter (Addendum)
Called and spoke with patient-patient agrees with plan of care and scheduled VCE on 02/09/2019 arrival at 8:00am for 8:30 appt; patient informed of instructions via phone call and patient requested to come to office to pick up instructions for patient knowledge; patient advised to call back to office if questions/concerns arise; Patient verbalized understanding of information/instructions;   amb ref to gastro ordered for pre cert of VCE;

## 2019-02-02 NOTE — Addendum Note (Signed)
Addended by: Johnney Killian on: 02/02/2019 11:17 AM   Modules accepted: Orders

## 2019-02-06 ENCOUNTER — Telehealth: Payer: Self-pay

## 2019-02-06 ENCOUNTER — Other Ambulatory Visit: Payer: Self-pay

## 2019-02-06 DIAGNOSIS — R634 Abnormal weight loss: Secondary | ICD-10-CM

## 2019-02-06 DIAGNOSIS — R1084 Generalized abdominal pain: Secondary | ICD-10-CM

## 2019-02-06 DIAGNOSIS — K5909 Other constipation: Secondary | ICD-10-CM

## 2019-02-06 NOTE — Telephone Encounter (Signed)
Patient returned call to office-patient informed of capsule endoscopy ordering error-patient also advised of virtual colonoscopy order placed and that [facility] would contact her with appt date/time, instructions for prep, and additional information should she request information; Patient verbalized understanding of information/instructions; Patient was advised to call back if questions/concerns arise;  Capsule endo appt cancelled;

## 2019-02-06 NOTE — Telephone Encounter (Signed)
Order placed in Epic for virtual colonoscopy per MD recommendations on colonoscopy procedure report; patient information faxed to [facility] staff reported they will get in touch with the patient concerning appt date/time, prep, and instructions;   Attempted to reach patient concerning this change in therapy (capsule endo ordered in error-appt for capsule endo cancelled by RN); will attempt to reach patient at a later date/time;

## 2019-02-12 ENCOUNTER — Telehealth: Payer: Self-pay | Admitting: Internal Medicine

## 2019-02-12 NOTE — Telephone Encounter (Signed)
Pt return call will like call back

## 2019-02-13 NOTE — Telephone Encounter (Signed)
Called and spoke with patient- patient reports she is not wanting to complete the required prep for the virtual colonscopy-importance of good prep and following through with the virtual colonoscopy was emphasized to the patient-patient was encouraged to complete recommended testing to obtain results in order for an appropriate plan of care to treat symptoms; patient reports she will call back to [facility] and ask a few more questions and will update [facility] when a decision is made; Patient was advised to call back if questions/concerns arise; Patient verbalized understanding of information/instructions;

## 2019-02-14 NOTE — Telephone Encounter (Signed)
Called and spoke with patient-patient reports she has been scheduled for her virtual colonoscopy on 02/27/2019 and has until 02/22/2019 to pick up instructions/information from [facility]; Patient was advised to call back if questions/concerns arise;Patient verbalized understanding of information/instructions;

## 2019-02-26 NOTE — Telephone Encounter (Signed)
Unable to take the prep for her Virtual Colonoscopy. Wants to ask nurse what she should do. Please call back after 4pm to [phone].

## 2019-02-27 ENCOUNTER — Other Ambulatory Visit: Payer: Medicare Other

## 2019-02-27 NOTE — Telephone Encounter (Signed)
Spoke with pt and she was unable to complete prep for virtual colon, she had nausea and vomiting with it. Pt states she called [facility] and was told she had to complete the prep. Pt cancelled the appt and wants to know if there is a different test that Dr. Marina Goodell could order for her. Please advise.

## 2019-02-27 NOTE — Telephone Encounter (Signed)
Left message for pt to call back  °

## 2019-03-03 NOTE — Telephone Encounter (Signed)
All tests to best evaluate the colon require a prep. Though sub optimal, let's schedule a contrast CT A/P to rule out gross abdominal or colonic process. Thanks

## 2019-03-05 NOTE — Telephone Encounter (Signed)
Left message for pt to call back  °

## 2019-03-05 NOTE — Telephone Encounter (Signed)
Spoke with pt and she is aware. States she could not drink the contrast for the virtual colon. Spoke with [facility] and it is the same contrast that is used for regular CT scans. Do you want CT done with IV contrast only? Please advise.

## 2019-03-05 NOTE — Telephone Encounter (Signed)
Goodness gracious.  CT with IV contrast only, then.

## 2019-03-06 ENCOUNTER — Other Ambulatory Visit: Payer: Self-pay

## 2019-03-06 DIAGNOSIS — Z1211 Encounter for screening for malignant neoplasm of colon: Secondary | ICD-10-CM

## 2019-03-06 NOTE — Telephone Encounter (Signed)
Pt to have labs prior to CT, orders in epic. Pt scheduled for CT of A/P at [facility] 03/22/19@4pm , pt to have no solid food after 12noon, pt to arrive there at 1:45pm to drink the contrast. Pt aware.

## 2019-03-07 ENCOUNTER — Telehealth: Payer: Self-pay

## 2019-03-07 NOTE — Telephone Encounter (Signed)
CT is requesting to reschedule the patients upcoming CT on 03/22/19 until after 03/30/19.  Please review for urgency.   

## 2019-03-08 NOTE — Telephone Encounter (Signed)
Yes. Urgent  

## 2019-03-21 ENCOUNTER — Telehealth: Payer: Self-pay | Admitting: Internal Medicine

## 2019-03-21 NOTE — Telephone Encounter (Signed)
It is impossible to know for certain.  Make a recall for CT in your recall system for 2 months.  Contact the patient at that time and see, given the coronavirus situation, if she is willing and it is advisable to proceed.  If not, we can readjust again at that time.  Thanks

## 2019-03-21 NOTE — Telephone Encounter (Signed)
Spoke with pt and let her know we will cancel the CT and get back in touch with her in a few months regarding rescheduling. Pt thanked me for the call.

## 2019-03-21 NOTE — Telephone Encounter (Signed)
Spoke wth pt due to covid19 she is wanting to cancel appt.

## 2019-03-21 NOTE — Telephone Encounter (Signed)
Pt wants to reschedule her CT scan due to covid-19. How far out should we reschedule her appt? Please advise.

## 2019-03-21 NOTE — Telephone Encounter (Signed)
Left message for pt to call back  °

## 2019-03-22 ENCOUNTER — Inpatient Hospital Stay: Admission: RE | Admit: 2019-03-22 | Payer: Medicare Other | Source: Ambulatory Visit

## 2019-03-23 ENCOUNTER — Inpatient Hospital Stay: Admission: RE | Admit: 2019-03-23 | Payer: Medicare Other | Source: Ambulatory Visit

## 2019-03-28 ENCOUNTER — Other Ambulatory Visit: Payer: Self-pay | Admitting: Nurse Practitioner

## 2019-05-22 ENCOUNTER — Telehealth: Payer: Self-pay

## 2019-05-22 NOTE — Telephone Encounter (Signed)
-----   Message from Chrystie Nose, RN sent at 03/21/2019  2:37 PM EDT ----- Regarding: CT Pt needs CT rescheduled due to covid

## 2019-05-22 NOTE — Telephone Encounter (Signed)
Left message for pt to call back  °

## 2019-05-30 NOTE — Telephone Encounter (Signed)
Left message for pt to call back  °

## 2019-05-31 ENCOUNTER — Other Ambulatory Visit: Payer: Self-pay

## 2019-05-31 ENCOUNTER — Telehealth: Payer: Self-pay | Admitting: Internal Medicine

## 2019-05-31 DIAGNOSIS — Z1211 Encounter for screening for malignant neoplasm of colon: Secondary | ICD-10-CM

## 2019-05-31 NOTE — Telephone Encounter (Signed)
Patient called requesting to speak back with the nurse. She stated that she spoken to nurse but have additional question about her schedule ct that she forgot to ask

## 2019-05-31 NOTE — Telephone Encounter (Signed)
Patient's questions answered.  She will call back for additional questions or concerns.  

## 2019-05-31 NOTE — Telephone Encounter (Signed)
Pts CT of A/P rescheduled at [facility] 06/06/19@1 :30pm, pt to arrive there at 1:15pm and to be NPO after 9am except for water. Pt to have labs prior to appt at our office, ct for IV contrast only. Pt aware.

## 2019-06-05 ENCOUNTER — Other Ambulatory Visit (INDEPENDENT_AMBULATORY_CARE_PROVIDER_SITE_OTHER): Payer: Medicare Other

## 2019-06-05 DIAGNOSIS — Z1211 Encounter for screening for malignant neoplasm of colon: Secondary | ICD-10-CM | POA: Diagnosis not present

## 2019-06-05 LAB — BUN: BUN: 12 mg/dL (ref 6–23)

## 2019-06-05 LAB — CREATININE, SERUM: Creatinine, Ser: 1.04 mg/dL (ref 0.40–1.20)

## 2019-06-06 ENCOUNTER — Ambulatory Visit (HOSPITAL_COMMUNITY)
Admission: RE | Admit: 2019-06-06 | Discharge: 2019-06-06 | Disposition: A | Discharge status: Home / Self Care | Payer: Medicare Other | Source: Ambulatory Visit | Attending: Internal Medicine | Admitting: Internal Medicine

## 2019-06-06 ENCOUNTER — Encounter (HOSPITAL_COMMUNITY): Payer: Self-pay

## 2019-06-06 ENCOUNTER — Other Ambulatory Visit: Payer: Self-pay

## 2019-06-06 DIAGNOSIS — K439 Ventral hernia without obstruction or gangrene: Secondary | ICD-10-CM | POA: Insufficient documentation

## 2019-06-06 DIAGNOSIS — N838 Other noninflammatory disorders of ovary, fallopian tube and broad ligament: Secondary | ICD-10-CM | POA: Insufficient documentation

## 2019-06-06 DIAGNOSIS — K5900 Constipation, unspecified: Secondary | ICD-10-CM | POA: Diagnosis not present

## 2019-06-06 DIAGNOSIS — Z1211 Encounter for screening for malignant neoplasm of colon: Secondary | ICD-10-CM | POA: Insufficient documentation

## 2019-06-06 MED ORDER — IOHEXOL 300 MG/ML  SOLN
100.0000 mL | Freq: Once | INTRAMUSCULAR | Status: AC | PRN
  Administered 2019-06-06: 100 mL via INTRAVENOUS

## 2019-06-06 MED ORDER — SODIUM CHLORIDE (PF) 0.9 % IJ SOLN
INTRAMUSCULAR | Status: AC
  Filled 2019-06-06: qty 50

## 2019-06-08 ENCOUNTER — Telehealth: Payer: Self-pay | Admitting: Internal Medicine

## 2019-06-08 NOTE — Telephone Encounter (Signed)
The pt is aware that Dr Henrene Pastor is off this week and will review when he is back in the office.

## 2019-06-08 NOTE — Telephone Encounter (Signed)
Patient called in wanting a call back regarding her ct results.

## 2019-07-02 ENCOUNTER — Encounter: Payer: Self-pay | Admitting: General Surgery

## 2019-07-02 ENCOUNTER — Telehealth: Payer: Self-pay | Admitting: General Surgery

## 2019-07-02 NOTE — Telephone Encounter (Signed)
Patient returned phone call. °

## 2019-07-02 NOTE — Telephone Encounter (Signed)
Left a voicemail for the patient to call to pre-screen for virtual visit on 07/03/2019

## 2019-07-02 NOTE — Telephone Encounter (Signed)
Returned the patients call to pre-screen

## 2019-07-03 ENCOUNTER — Ambulatory Visit (INDEPENDENT_AMBULATORY_CARE_PROVIDER_SITE_OTHER): Payer: Medicare Other | Admitting: Internal Medicine

## 2019-07-03 ENCOUNTER — Encounter: Payer: Self-pay | Admitting: Internal Medicine

## 2019-07-03 VITALS — Ht 63.0 in | Wt 225.0 lb

## 2019-07-03 DIAGNOSIS — R1084 Generalized abdominal pain: Secondary | ICD-10-CM | POA: Diagnosis not present

## 2019-07-03 DIAGNOSIS — K5909 Other constipation: Secondary | ICD-10-CM

## 2019-07-03 DIAGNOSIS — R935 Abnormal findings on diagnostic imaging of other abdominal regions, including retroperitoneum: Secondary | ICD-10-CM

## 2019-07-03 DIAGNOSIS — K439 Ventral hernia without obstruction or gangrene: Secondary | ICD-10-CM

## 2019-07-04 ENCOUNTER — Encounter: Payer: Self-pay | Admitting: Internal Medicine

## 2019-07-04 NOTE — Progress Notes (Signed)
HISTORY OF PRESENT ILLNESS:  Frances [name] is a 73 y.o. female with past medical history as listed below who was evaluated in this office January 05, 2019 with acute complaints on top of her chronic GI complaints.  Specifically the recent development of severe migrating spasm-like abdominal pain.  There was associated weight loss.  She was given symptomatic therapies for constipation and spasm.  Physical exam revealed a fullness or mass-like area in the lower aspect of the left upper quadrant.  She was subsequently set up for complete colonoscopy February 01, 2019.  The examination was incomplete due to torsion of the proximal left colon.  There was incidental left-sided diverticulosis.  Virtual colonoscopy was recommended.  However the patient declined that she did not want to take a bowel prep.  Thus, she underwent CT scan of the abdomen pelvis with contrast.  The most significant finding was left lower quadrant ventral abdominal wall hernia containing a nonobstructed loop of distal transverse colon and a small amount of fluid.  She presents today for follow-up via telehealth medicine during the coronavirus pandemic.  She is accompanied by her daughter who participates in the encounter.  They tell me that she continues with her chronic GI complaints such as gas and bloating discomfort which seems worse with carbonated beverages.  Some constipation managed with symptomatic therapies.  Bentyl for abdominal cramping seems to help as well.  She occasionally has more severe type pain that she presented with earlier in the year.  I reviewed with them the findings of her colonoscopy and CT scan.  I informed him that the incomplete colonoscopy was due to entrapment of the large intestine and the abdominal wall hernia.  Also that this may likely explain the intermittent problems with intermittent worsening abdominal pain and weight loss due to some food avoidance.  She did have umbilical hernia repair in 1999 with Dr.  Zella Richer, who is since retired.  REVIEW OF SYSTEMS:  All non-GI ROS negative unless otherwise stated in the HPI except for anxiety, arthritis  Past Medical History:  Diagnosis Date  . Anxiety   . Arthritis   . Blood in stool   . Depression   . GERD (gastroesophageal reflux disease)   . Hypertension   . Sleep apnea   . Thyroid disease   . UTI (urinary tract infection)     Past Surgical History:  Procedure Laterality Date  . HERNIA REPAIR      Social History [name]  reports that she has never smoked. She has never used smokeless tobacco. She reports that she does not drink alcohol or use drugs.  family history includes Alcohol abuse in her father; Arthritis in her father, maternal grandfather, maternal grandmother, mother, paternal grandfather, and paternal grandmother; Stroke in her father and maternal grandmother.  No Known Allergies     PHYSICAL EXAMINATION: No physical examination with telehealth medicine visit   ASSESSMENT:  1.  Symptomatic abdominal wall hernia with entrapment of nonobstructed loop of large intestine on imaging and colonoscopy 2.  History of umbilical hernia repair 5427 with Dr. Zella Richer 3.  GERD. 4.  Bloating constipation  PLAN:  1.  ARRANGE general surgical consultation regarding her symptomatic abdominal wall hernia. 2.  Symptomatic therapies for other diagnoses This telehealth medicine visit was initiated by and consented for by the patient who was in her home while I was in my office.  She understands her may be an associated professional charge for this service which totaled 15 minutes duration

## 2019-07-04 NOTE — Patient Instructions (Addendum)
If you are age 71 or older, your body mass index should be between 23-30. Your Body mass index is 39.86 kg/m. If this is out of the aforementioned range listed, please consider follow up with your Primary Care Provider.  If you are age 59 or younger, your body mass index should be between 19-25. Your Body mass index is 39.86 kg/m. If this is out of the aformentioned range listed, please consider follow up with your Primary Care Provider.   We are referring you to [facility] for a surgical consultation regarding your abdominal hernia. They will call you to schedule. If you have not heard from them within 7 days please contact their office at [phone].

## 2019-07-06 ENCOUNTER — Telehealth: Payer: Self-pay

## 2019-07-06 NOTE — Telephone Encounter (Signed)
Patient called to check on status of surgical consult faxed to [facility] 07/04/2019.  I called and left a message for Judson Roch, the scheduler, to follow up.

## 2019-07-17 NOTE — Telephone Encounter (Signed)
Patient has been scheduled at [facility] for Aug. 6 th

## 2019-07-18 ENCOUNTER — Telehealth: Payer: Self-pay | Admitting: Internal Medicine

## 2019-07-18 NOTE — Telephone Encounter (Signed)
Patient would like a return call from the nurse concerning exactly what was done on 09/25/2018 and she would like to know if she has received a Shingles vaccine.

## 2019-07-19 NOTE — Telephone Encounter (Signed)
Called patient back and was wanting to know why she was being charged for a nurse injection for TDAP. I told her I would ask and see if we cannot get the question sent to billing to be able to take a look at it. Is this something you can help me with?

## 2019-07-20 NOTE — Telephone Encounter (Signed)
I have left pt vm to call back in regard. Medicare does not typically cover TDAP.

## 2019-07-24 ENCOUNTER — Ambulatory Visit: Payer: Self-pay

## 2019-07-24 NOTE — Telephone Encounter (Signed)
Patient called and says she's been having neck pain and a headache since Saturday. She says the pain is to the back of her neck and goes into her head at a 10 when it's the worst pain. She says she's been taking Aleve and the pain eases with taking Aleve, but comes back after it wears off. She denies neck stiffness, no fever, no other symptoms. I called the office and spoke to [place], [facility] who asked to speak to the patient, the call was connected successfully.   Answer Assessment - Initial Assessment Questions 1. ONSET: "When did the pain begin?"      Saturday 2. LOCATION: "Where does it hurt?"      Back of the neck 3. PATTERN "Does the pain come and go, or has it been constant since it started?"      Constant; relief with Aleve, takes the edge off 4. SEVERITY: "How bad is the pain?"  (Scale 1-10; or mild, moderate, severe)   - MILD (1-3): doesn't interfere with normal activities    - MODERATE (4-7): interferes with normal activities or awakens from sleep    - SEVERE (8-10):  excruciating pain, unable to do any normal activities      10 5. RADIATION: "Does the pain go anywhere else, shoot into your arms?"     To the head 6. CORD SYMPTOMS: "Any weakness or numbness of the arms or legs?"     No 7. CAUSE: "What do you think is causing the neck pain?"     No 8. NECK OVERUSE: "Any recent activities that involved turning or twisting the neck?"     No 9. OTHER SYMPTOMS: "Do you have any other symptoms?" (e.g., headache, fever, chest pain, difficulty breathing, neck swelling)     Headache 10. PREGNANCY: "Is there any chance you are pregnant?" "When was your last menstrual period?"       No  Protocols used: NECK PAIN OR STIFFNESS-A-AH

## 2019-07-24 NOTE — Telephone Encounter (Signed)
Patient called, left VM to return the call to the office.   Message from Rayann Heman sent at 07/24/2019 3:26 PM EDT  Summary: neck pain / Headaches    Pt called and stated that she is having neck pain and headaches. Pt would like to know what she can do. Please advise

## 2019-07-25 ENCOUNTER — Encounter: Payer: Self-pay | Admitting: Internal Medicine

## 2019-07-25 ENCOUNTER — Ambulatory Visit (INDEPENDENT_AMBULATORY_CARE_PROVIDER_SITE_OTHER): Payer: Medicare Other | Admitting: Internal Medicine

## 2019-07-25 DIAGNOSIS — R519 Headache, unspecified: Secondary | ICD-10-CM | POA: Insufficient documentation

## 2019-07-25 DIAGNOSIS — G44209 Tension-type headache, unspecified, not intractable: Secondary | ICD-10-CM

## 2019-07-25 MED ORDER — BACLOFEN 10 MG PO TABS: 10.0000 mg | ORAL_TABLET | Freq: Two times a day (BID) | ORAL | 0 refills | Status: DC | PRN

## 2019-07-25 NOTE — Assessment & Plan Note (Signed)
New acute headache and neck ache with some generalized body aches. Offered covid-19 testing but she declines. Rx for baclofen and can continue using aleve for pain. If no improvement or worsening or new symptoms she will call and then would agree for testing for covid-19. Advised her to self quarantine in case until she is feeling better.

## 2019-07-25 NOTE — Telephone Encounter (Signed)
Noted telephone visit for this morning

## 2019-07-25 NOTE — Progress Notes (Signed)
Virtual Visit via Audio Note  I connected with Harmani Neto on 07/25/19 at  8:20 AM EDT by an audio-only enabled telemedicine application and verified that I am speaking with the correct person using two identifiers.  The patient and the provider were at separate locations throughout the entire encounter.   I discussed the limitations of evaluation and management by telemedicine and the availability of in person appointments. The patient expressed understanding and agreed to proceed.  History of Present Illness: The patient is a 73 y.o. female with visit for neck and head pain. Started about 4 days ago with intense headaches and neck pain. For a couple weeks she has been having some mild headaches in the morning and leg stiffness. Has some mild cough and drainage for the whole summer and denies that being worse recently. Denies fevers or chills. Overall it is stable but not worsening. She has been helping to care for her 88 YO grandson. Has tried aleve for the pain twice a day and this helps some with the headache but it comes back. This does not resolve the pain but takes the edge off the pain. Pain is 10/10 at maximum. Sometimes radiates into her ears and throat. Denies any radiation to her arms of the pain. No numbness in her arms.   Observations/Objective: Voice strong, no coughing or dyspnea during visit, A and O times 3 Temp 97.73F this morning  Assessment and Plan: See problem oriented charting  Follow Up Instructions: rx baclofen, offered covid-19 testing which was declined  Visit time 21 minutes: that time was spent in face to face counseling and coordination of care with the patient: counseled about as above  I discussed the assessment and treatment plan with the patient. The patient was provided an opportunity to ask questions and all were answered. The patient agreed with the plan and demonstrated an understanding of the instructions.   The patient was advised to call back or seek an  in-person evaluation if the symptoms worsen or if the condition fails to improve as anticipated.  Hoyt Koch, MD

## 2019-08-09 ENCOUNTER — Other Ambulatory Visit: Payer: Self-pay | Admitting: Internal Medicine

## 2019-09-18 ENCOUNTER — Other Ambulatory Visit: Payer: Self-pay | Admitting: Internal Medicine

## 2019-09-18 DIAGNOSIS — I10 Essential (primary) hypertension: Secondary | ICD-10-CM

## 2019-09-18 DIAGNOSIS — E039 Hypothyroidism, unspecified: Secondary | ICD-10-CM

## 2019-10-23 ENCOUNTER — Ambulatory Visit: Payer: Medicare Other

## 2019-10-31 ENCOUNTER — Ambulatory Visit (INDEPENDENT_AMBULATORY_CARE_PROVIDER_SITE_OTHER): Payer: Medicare Other | Admitting: *Deleted

## 2019-10-31 DIAGNOSIS — Z0000 Encounter for general adult medical examination without abnormal findings: Secondary | ICD-10-CM | POA: Diagnosis not present

## 2019-10-31 NOTE — Progress Notes (Signed)
Medical screening examination/treatment/procedure(s) were performed by non-physician practitioner and as supervising physician I was immediately available for consultation/collaboration. I agree with above. Whitlee Sluder A Ayaana Biondo, MD 

## 2019-10-31 NOTE — Progress Notes (Signed)
Subjective:   Frances LindauBrenda Lisenbee is a 73 y.o. female who presents for Medicare Annual (Subsequent) preventive examination. I connected with patient by a telephone and verified that I am speaking with the correct person using two identifiers. Patient stated full name and DOB. Patient gave permission to continue with telephonic visit. Patient's location was at home and Nurse's location was at [facility] office. Participants during this visit included patient and nurse.  Review of Systems:   Cardiac Risk Factors include: advanced age (>5955men, 38>65 women);hypertension Sleep patterns: has interrupted sleep, gets up 2-3 times nightly to void and sleeps 5-6 hours nightly. Patient reports insomnia issues, discussed recommended sleep tips and stress reduction tips, education was attached to patient's AVS.  Home Safety/Smoke Alarms: Feels safe in home. Smoke alarms in place.  Living environment; residence and Firearm Safety: 1-story house/ trailer. Daughter and her children lives with patient, no needs for DME, good support system Seat Belt Safety/Bike Helmet: Wears seat belt.      Objective:     Vitals: There were no vitals taken for this visit.  There is no height or weight on file to calculate BMI.  Advanced Directives 10/31/2019  Does Patient Have a Medical Advance Directive? No  Does patient want to make changes to medical advance directive? Yes (ED - Information included in AVS)    Tobacco Social History   Tobacco Use  Smoking Status Never Smoker  Smokeless Tobacco Never Used     Counseling given: Not Answered  Past Medical History:  Diagnosis Date  . Anxiety   . Arthritis   . Blood in stool   . Depression   . GERD (gastroesophageal reflux disease)   . Hypertension   . Sleep apnea   . Thyroid disease   . UTI (urinary tract infection)    Past Surgical History:  Procedure Laterality Date  . HERNIA REPAIR     umbilical repair   Family History  Problem Relation Age of Onset  .  Arthritis Mother   . Alcohol abuse Father   . Arthritis Father   . Stroke Father   . Arthritis Maternal Grandmother   . Stroke Maternal Grandmother   . Arthritis Maternal Grandfather   . Arthritis Paternal Grandmother   . Arthritis Paternal Grandfather   . Colon cancer Neg Hx   . Esophageal cancer Neg Hx   . Rectal cancer Neg Hx   . Stomach cancer Neg Hx    Social History   Socioeconomic History  . Marital status: Divorced    Spouse name: Not on file  . Number of children: 1  . Years of education: Not on file  . Highest education level: Not on file  Occupational History  . Occupation: retired  Engineer, productionocial Needs  . Financial resource strain: Not hard at all  . Food insecurity    Worry: Never true    Inability: Never true  . Transportation needs    Medical: No    Non-medical: No  Tobacco Use  . Smoking status: Never Smoker  . Smokeless tobacco: Never Used  Substance and Sexual Activity  . Alcohol use: No    Alcohol/week: 0.0 standard drinks  . Drug use: No  . Sexual activity: Not Currently  Lifestyle  . Physical activity    Days per week: 0 days    Minutes per session: 0 min  . Stress: Not at all  Relationships  . Social connections    Talks on phone: More than three times a week  Gets together: More than three times a week    Attends religious service: 1 to 4 times per year    Active member of club or organization: Not on file    Attends meetings of clubs or organizations: 1 to 4 times per year    Relationship status: Divorced  Other Topics Concern  . Not on file  Social History Narrative  . Not on file    Outpatient Encounter Medications as of 10/31/2019  Medication Sig  . amLODipine (NORVASC) 5 MG tablet Take 1 tablet (5 mg total) by mouth daily. NEED ANNUAL VISIT FOR FURTHER REFILLS  . colchicine 0.6 MG tablet Take 1 tablet (0.6 mg total) by mouth 2 (two) times daily between meals as needed.  Marland Kitchen levothyroxine (SYNTHROID) 50 MCG tablet Take 1 tablet (50 mcg  total) by mouth daily before breakfast. NEED ANNUAL VISIT FOR FURTHER REFILLS  . lisinopril (ZESTRIL) 40 MG tablet Take 1 tablet (40 mg total) by mouth daily. NEED ANNUAL VISIT FOR FURTHER REFILLS  . metoprolol succinate (TOPROL-XL) 100 MG 24 hr tablet Take 1 tablet (100 mg total) by mouth 2 (two) times daily. NEED ANNUAL VISIT FOR FURTHER REFILLS  . Multiple Vitamin (MULTIVITAMINS PO) Take by mouth.  . polyethylene glycol (MIRALAX / GLYCOLAX) packet Take 17 g by mouth daily as needed.  . [DISCONTINUED] baclofen (LIORESAL) 10 MG tablet Take 1 tablet (10 mg total) by mouth 2 (two) times daily as needed for muscle spasms. (Patient not taking: Reported on 10/31/2019)  . [DISCONTINUED] dicyclomine (BENTYL) 10 MG capsule TAKE 1 CAPSULE BY MOUTH TWICE A DAY (Patient not taking: Reported on 10/31/2019)   No facility-administered encounter medications on file as of 10/31/2019.     Activities of Daily Living In your present state of health, do you have any difficulty performing the following activities: 10/31/2019  Hearing? N  Vision? N  Difficulty concentrating or making decisions? N  Walking or climbing stairs? N  Dressing or bathing? N  Doing errands, shopping? N  Preparing Food and eating ? N  Using the Toilet? N  In the past six months, have you accidently leaked urine? N  Do you have problems with loss of bowel control? N  Managing your Medications? N  Managing your Finances? N  Housekeeping or managing your Housekeeping? N  Some recent data might be hidden    Patient Care Team: Myrlene Broker, MD as PCP - General (Internal Medicine) Hilarie Fredrickson, MD as Consulting Physician (Gastroenterology)    Assessment:   This is a routine wellness examination for Frances [name]. Physical assessment deferred to PCP.   Exercise Activities and Dietary recommendations Current Exercise Habits: The patient does not participate in regular exercise at present, Exercise limited by: None identified   Diet (meal preparation, eat out, water intake, caffeinated beverages, dairy products, fruits and vegetables): in general, a "healthy" diet  , on average, 1-2 fast food meals per week   Reviewed heart healthy diet. Encouraged patient to increase daily water and healthy fluid intake.  Goals    . Patient Stated     I want to listen to my gospel music and take time to read the bible daily.       Fall Risk Fall Risk  10/31/2019 09/25/2018 12/17/2015  Falls in the past year? 0 Yes No  Number falls in past yr: 0 1 -  Injury with Fall? 0 No -  Follow up Falls prevention discussed - -    Depression Screen PHQ 2/9  Scores 10/31/2019 09/25/2018 12/17/2015  PHQ - 2 Score 2 1 0  PHQ- 9 Score 5 - -     Cognitive Function       Ad8 score reviewed for issues:  Issues making decisions: no  Less interest in hobbies / activities: no  Repeats questions, stories (family complaining): no  Trouble using ordinary gadgets (microwave, computer, phone):no  Forgets the month or year: no  Mismanaging finances: no  Remembering appts: no  Daily problems with thinking and/or memory: no Ad8 score is= 0  Immunization History  Administered Date(s) Administered  . Influenza, High Dose Seasonal PF 10/27/2016, 09/21/2017  . Influenza,inj,Quad PF,6+ Mos 12/17/2015  . Influenza-Unspecified 09/19/2018, 09/17/2019  . Pneumococcal Conjugate-13 12/17/2015  . Pneumococcal Polysaccharide-23 09/21/2017  . Tdap 09/25/2018   Screening Tests Health Maintenance  Topic Date Due  . MAMMOGRAM  10/30/2020 (Originally 07/31/1998)  . DEXA SCAN  10/30/2020 (Originally 07/31/2013)  . TETANUS/TDAP  09/25/2028  . COLONOSCOPY  02/01/2029  . INFLUENZA VACCINE  Completed  . Hepatitis C Screening  Completed  . PNA vac Low Risk Adult  Completed      Plan:    Reviewed health maintenance screenings with patient today and relevant education, vaccines, and/or referrals were provided.   I have personally reviewed and  noted the following in the patient's chart:   . Medical and social history . Use of alcohol, tobacco or illicit drugs  . Current medications and supplements . Functional ability and status . Nutritional status . Physical activity . Advanced directives . List of other physicians . Screenings to include cognitive, depression, and falls . Referrals and appointments  In addition, I have reviewed and discussed with patient certain preventive protocols, quality metrics, and best practice recommendations. A written personalized care plan for preventive services as well as general preventive health recommendations were provided to patient.     Michiel Cowboy, RN  10/31/2019

## 2019-10-31 NOTE — Patient Instructions (Addendum)
Continue doing brain stimulating activities (puzzles, reading, adult coloring books, staying active) to keep memory sharp.   Continue to eat heart healthy diet (full of fruits, vegetables, whole grains, lean protein, water--limit salt, fat, and sugar intake) and increase physical activity as tolerated.   [name] , Thank you for taking time to come for your Medicare Wellness Visit. I appreciate your ongoing commitment to your health goals. Please review the following plan we discussed and let me know if I can assist you in the future.   These are the goals we discussed: Goals    . Patient Stated     I want to listen to my gospel music and take time to read the bible daily.       This is a list of the screening recommended for you and due dates:  Health Maintenance  Topic Date Due  . Mammogram  10/30/2020*  . DEXA scan (bone density measurement)  10/30/2020*  . Tetanus Vaccine  09/25/2028  . Colon Cancer Screening  02/01/2029  . Flu Shot  Completed  .  Hepatitis C: One time screening is recommended by Center for Disease Control  (CDC) for  adults born from [birth year] through 1963.   Completed  . Pneumonia vaccines  Completed  *Topic was postponed. The date shown is not the original due date.    Insomnia Insomnia is a sleep disorder that makes it difficult to fall asleep or stay asleep. Insomnia can cause fatigue, low energy, difficulty concentrating, mood swings, and poor performance at work or school. There are three different ways to classify insomnia:  Difficulty falling asleep.  Difficulty staying asleep.  Waking up too early in the morning. Any type of insomnia can be long-term (chronic) or short-term (acute). Both are common. Short-term insomnia usually lasts for three months or less. Chronic insomnia occurs at least three times a week for longer than three months. What are the causes? Insomnia may be caused by another condition, situation, or substance, such as:  Anxiety.   Certain medicines.  Gastroesophageal reflux disease (GERD) or other gastrointestinal conditions.  Asthma or other breathing conditions.  Restless legs syndrome, sleep apnea, or other sleep disorders.  Chronic pain.  Menopause.  Stroke.  Abuse of alcohol, tobacco, or illegal drugs.  Mental health conditions, such as depression.  Caffeine.  Neurological disorders, such as Alzheimer's disease.  An overactive thyroid (hyperthyroidism). Sometimes, the cause of insomnia may not be known. What increases the risk? Risk factors for insomnia include:  Gender. Women are affected more often than men.  Age. Insomnia is more common as you get older.  Stress.  Lack of exercise.  Irregular work schedule or working night shifts.  Traveling between different time zones.  Certain medical and mental health conditions. What are the signs or symptoms? If you have insomnia, the main symptom is having trouble falling asleep or having trouble staying asleep. This may lead to other symptoms, such as:  Feeling fatigued or having low energy.  Feeling nervous about going to sleep.  Not feeling rested in the morning.  Having trouble concentrating.  Feeling irritable, anxious, or depressed. How is this diagnosed? This condition may be diagnosed based on:  Your symptoms and medical history. Your health care provider may ask about: ? Your sleep habits. ? Any medical conditions you have. ? Your mental health.  A physical exam. How is this treated? Treatment for insomnia depends on the cause. Treatment may focus on treating an underlying condition that is causing  insomnia. Treatment may also include:  Medicines to help you sleep.  Counseling or therapy.  Lifestyle adjustments to help you sleep better. Follow these instructions at home: Eating and drinking   Limit or avoid alcohol, caffeinated beverages, and cigarettes, especially close to bedtime. These can disrupt your  sleep.  Do not eat a large meal or eat spicy foods right before bedtime. This can lead to digestive discomfort that can make it hard for you to sleep. Sleep habits   Keep a sleep diary to help you and your health care provider figure out what could be causing your insomnia. Write down: ? When you sleep. ? When you wake up during the night. ? How well you sleep. ? How rested you feel the next day. ? Any side effects of medicines you are taking. ? What you eat and drink.  Make your bedroom a dark, comfortable place where it is easy to fall asleep. ? Put up shades or blackout curtains to block light from outside. ? Use a white noise machine to block noise. ? Keep the temperature cool.  Limit screen use before bedtime. This includes: ? Watching TV. ? Using your smartphone, tablet, or computer.  Stick to a routine that includes going to bed and waking up at the same times every day and night. This can help you fall asleep faster. Consider making a quiet activity, such as reading, part of your nighttime routine.  Try to avoid taking naps during the day so that you sleep better at night.  Get out of bed if you are still awake after 15 minutes of trying to sleep. Keep the lights down, but try reading or doing a quiet activity. When you feel sleepy, go back to bed. General instructions  Take over-the-counter and prescription medicines only as told by your health care provider.  Exercise regularly, as told by your health care provider. Avoid exercise starting several hours before bedtime.  Use relaxation techniques to manage stress. Ask your health care provider to suggest some techniques that may work well for you. These may include: ? Breathing exercises. ? Routines to release muscle tension. ? Visualizing peaceful scenes.  Make sure that you drive carefully. Avoid driving if you feel very sleepy.  Keep all follow-up visits as told by your health care provider. This is important.  Contact a health care provider if:  You are tired throughout the day.  You have trouble in your daily routine due to sleepiness.  You continue to have sleep problems, or your sleep problems get worse. Get help right away if:  You have serious thoughts about hurting yourself or someone else. If you ever feel like you may hurt yourself or others, or have thoughts about taking your own life, get help right away. You can go to your nearest emergency department or call:  Your local emergency services (911 in the [place]  A suicide crisis helpline, such as the [facility] at 614-338-1627. This is open 24 hours a day. Summary  Insomnia is a sleep disorder that makes it difficult to fall asleep or stay asleep.  Insomnia can be long-term (chronic) or short-term (acute).  Treatment for insomnia depends on the cause. Treatment may focus on treating an underlying condition that is causing insomnia.  Keep a sleep diary to help you and your health care provider figure out what could be causing your insomnia. This information is not intended to replace advice given to you by your health care provider. Make  sure you discuss any questions you have with your health care provider. Document Released: 12/03/2000 Document Revised: 11/18/2017 Document Reviewed: 09/15/2017 Elsevier Patient Education  2020 [place] 65 Years and Older, Female Preventive care refers to lifestyle choices and visits with your health care provider that can promote health and wellness. This includes:  A yearly physical exam. This is also called an annual well check.  Regular dental and eye exams.  Immunizations.  Screening for certain conditions.  Healthy lifestyle choices, such as diet and exercise. What can I expect for my preventive care visit? Physical exam Your health care provider will check:  Height and weight. These may be used to calculate body mass index  (BMI), which is a measurement that tells if you are at a healthy weight.  Heart rate and blood pressure.  Your skin for abnormal spots. Counseling Your health care provider may ask you questions about:  Alcohol, tobacco, and drug use.  Emotional well-being.  Home and relationship well-being.  Sexual activity.  Eating habits.  History of falls.  Memory and ability to understand (cognition).  Work and work Statistician.  Pregnancy and menstrual history. What immunizations do I need?  Influenza (flu) vaccine  This is recommended every year. Tetanus, diphtheria, and pertussis (Tdap) vaccine  You may need a Td booster every 10 years. Varicella (chickenpox) vaccine  You may need this vaccine if you have not already been vaccinated. Zoster (shingles) vaccine  You may need this after age 69. Pneumococcal conjugate (PCV13) vaccine  One dose is recommended after age 91. Pneumococcal polysaccharide (PPSV23) vaccine  One dose is recommended after age 27. Measles, mumps, and rubella (MMR) vaccine  You may need at least one dose of MMR if you were born in 1957 or later. You may also need a second dose. Meningococcal conjugate (MenACWY) vaccine  You may need this if you have certain conditions. Hepatitis A vaccine  You may need this if you have certain conditions or if you travel or work in places where you may be exposed to hepatitis A. Hepatitis B vaccine  You may need this if you have certain conditions or if you travel or work in places where you may be exposed to hepatitis B. Haemophilus influenzae type b (Hib) vaccine  You may need this if you have certain conditions. You may receive vaccines as individual doses or as more than one vaccine together in one shot (combination vaccines). Talk with your health care provider about the risks and benefits of combination vaccines. What tests do I need? Blood tests  Lipid and cholesterol levels. These may be checked every  5 years, or more frequently depending on your overall health.  Hepatitis C test.  Hepatitis B test. Screening  Lung cancer screening. You may have this screening every year starting at age 26 if you have a 30-pack-year history of smoking and currently smoke or have quit within the past 15 years.  Colorectal cancer screening. All adults should have this screening starting at age 74 and continuing until age 35. Your health care provider may recommend screening at age 84 if you are at increased risk. You will have tests every 1-10 years, depending on your results and the type of screening test.  Diabetes screening. This is done by checking your blood sugar (glucose) after you have not eaten for a while (fasting). You may have this done every 1-3 years.  Mammogram. This may be done every 1-2 years. Talk with your health  care provider about how often you should have regular mammograms.  BRCA-related cancer screening. This may be done if you have a family history of breast, ovarian, tubal, or peritoneal cancers. Other tests  Sexually transmitted disease (STD) testing.  Bone density scan. This is done to screen for osteoporosis. You may have this done starting at age 63. Follow these instructions at home: Eating and drinking  Eat a diet that includes fresh fruits and vegetables, whole grains, lean protein, and low-fat dairy products. Limit your intake of foods with high amounts of sugar, saturated fats, and salt.  Take vitamin and mineral supplements as recommended by your health care provider.  Do not drink alcohol if your health care provider tells you not to drink.  If you drink alcohol: ? Limit how much you have to 0-1 drink a day. ? Be aware of how much alcohol is in your drink. In the [place] one drink equals one 12 oz bottle of beer (355 mL), one 5 oz glass of [place] (148 mL), or one 1 oz glass of hard liquor (44 mL). Lifestyle  Take daily care of your teeth and gums.  Stay active.  Exercise for at least 30 minutes on 5 or more days each week.  Do not use any products that contain nicotine or tobacco, such as cigarettes, e-cigarettes, and chewing tobacco. If you need help quitting, ask your health care provider.  If you are sexually active, practice safe sex. Use a condom or other form of protection in order to prevent STIs (sexually transmitted infections).  Talk with your health care provider about taking a low-dose aspirin or statin. What's next?  Go to your health care provider once a year for a well check visit.  Ask your health care provider how often you should have your eyes and teeth checked.  Stay up to date on all vaccines. This information is not intended to replace advice given to you by your health care provider. Make sure you discuss any questions you have with your health care provider. Document Released: 01/02/2016 Document Revised: 11/30/2018 Document Reviewed: 11/30/2018 Elsevier Patient Education  2020 [employer].

## 2019-12-17 ENCOUNTER — Other Ambulatory Visit: Payer: Self-pay | Admitting: Internal Medicine

## 2019-12-17 DIAGNOSIS — I10 Essential (primary) hypertension: Secondary | ICD-10-CM

## 2019-12-17 DIAGNOSIS — E039 Hypothyroidism, unspecified: Secondary | ICD-10-CM

## 2020-01-08 ENCOUNTER — Ambulatory Visit: Payer: Medicare Other | Attending: Internal Medicine

## 2020-01-08 DIAGNOSIS — Z23 Encounter for immunization: Secondary | ICD-10-CM

## 2020-01-08 NOTE — Progress Notes (Signed)
   Covid-19 Vaccination Clinic  Name:  Frances [name]    MRN: [id number] DOB: [birth year]  01/08/2020  [name] was observed post Covid-19 immunization for 15 minutes without incidence. She was provided with Vaccine Information Sheet and instruction to access the V-Safe system.   [name] was instructed to call 911 with any severe reactions post vaccine: Marland Kitchen Difficulty breathing  . Swelling of your face and throat  . A fast heartbeat  . A bad rash all over your body  . Dizziness and weakness    Immunizations Administered    Name Date Dose VIS Date Route   Pfizer COVID-19 Vaccine 01/08/2020  6:19 PM 0.3 mL 11/30/2019 Intramuscular   Manufacturer: [employer], [employer]   Lot: [id number]   NDC: [address]-6

## 2020-01-28 ENCOUNTER — Ambulatory Visit: Payer: Medicare Other

## 2020-01-29 ENCOUNTER — Ambulatory Visit: Payer: Medicare Other | Attending: Internal Medicine

## 2020-01-29 DIAGNOSIS — Z23 Encounter for immunization: Secondary | ICD-10-CM

## 2020-01-29 NOTE — Progress Notes (Signed)
   Covid-19 Vaccination Clinic  Name:  Vianne Grieshop    MRN: [id number] DOB: 08/12/1946  01/29/2020  Ms. Cabeza was observed post Covid-19 immunization for 15 minutes without incidence. She was provided with Vaccine Information Sheet and instruction to access the V-Safe system.   Ms. Fedie was instructed to call 911 with any severe reactions post vaccine: Marland Kitchen Difficulty breathing  . Swelling of your face and throat  . A fast heartbeat  . A bad rash all over your body  . Dizziness and weakness    Immunizations Administered    Name Date Dose VIS Date Route   Pfizer COVID-19 Vaccine 01/29/2020  9:58 AM 0.3 mL 11/30/2019 Intramuscular   Manufacturer: [employer], [employer]   Lot: [id number]   NDC: [address]-8

## 2020-06-02 ENCOUNTER — Telehealth: Payer: Self-pay | Admitting: Internal Medicine

## 2020-06-02 NOTE — Progress Notes (Signed)
  Chronic Care Management   Outreach Note  06/02/2020 Name: Baylyn Sickles MRN: [id number] DOB: 02/01/1946  Referred by: Myrlene Broker, MD Reason for referral : No chief complaint on file.   An unsuccessful telephone outreach was attempted today. The patient was referred to the pharmacist for assistance with care management and care coordination. This note is not being shared with the patient for the following reason: To respect privacy (The patient or proxy has requested that the information not be shared).  Follow Up Plan:   Lynnae January [employer] Scheduler

## 2020-06-19 ENCOUNTER — Telehealth: Payer: Self-pay | Admitting: Internal Medicine

## 2020-06-19 ENCOUNTER — Ambulatory Visit: Payer: Medicare Other | Admitting: Internal Medicine

## 2020-06-19 NOTE — Telephone Encounter (Signed)
New message:   Pt is calling and states she has a UTI and would like medication called into the pharmacy for her. I stated to the pt she would need an appt for a urine culture to be done. Pt stated she would like to see if something can just be sent in. Please advise.

## 2020-06-19 NOTE — Telephone Encounter (Signed)
Last OV was 07/25/19. Appointment is needed.

## 2020-06-20 ENCOUNTER — Ambulatory Visit: Payer: Medicare Other | Admitting: Family

## 2020-06-20 ENCOUNTER — Ambulatory Visit: Payer: Medicare Other | Admitting: Internal Medicine

## 2020-06-24 ENCOUNTER — Other Ambulatory Visit: Payer: Self-pay

## 2020-06-24 ENCOUNTER — Ambulatory Visit (INDEPENDENT_AMBULATORY_CARE_PROVIDER_SITE_OTHER): Payer: Medicare Other | Admitting: Internal Medicine

## 2020-06-24 ENCOUNTER — Encounter: Payer: Self-pay | Admitting: Internal Medicine

## 2020-06-24 VITALS — BP 144/92 | HR 71 | Temp 98.5°F | Ht 63.0 in | Wt 222.0 lb

## 2020-06-24 DIAGNOSIS — R399 Unspecified symptoms and signs involving the genitourinary system: Secondary | ICD-10-CM

## 2020-06-24 DIAGNOSIS — R739 Hyperglycemia, unspecified: Secondary | ICD-10-CM

## 2020-06-24 DIAGNOSIS — E039 Hypothyroidism, unspecified: Secondary | ICD-10-CM

## 2020-06-24 DIAGNOSIS — E559 Vitamin D deficiency, unspecified: Secondary | ICD-10-CM | POA: Diagnosis not present

## 2020-06-24 DIAGNOSIS — F4323 Adjustment disorder with mixed anxiety and depressed mood: Secondary | ICD-10-CM

## 2020-06-24 DIAGNOSIS — N3941 Urge incontinence: Secondary | ICD-10-CM

## 2020-06-24 DIAGNOSIS — B372 Candidiasis of skin and nail: Secondary | ICD-10-CM

## 2020-06-24 DIAGNOSIS — N3000 Acute cystitis without hematuria: Secondary | ICD-10-CM | POA: Insufficient documentation

## 2020-06-24 DIAGNOSIS — F4320 Adjustment disorder, unspecified: Secondary | ICD-10-CM | POA: Insufficient documentation

## 2020-06-24 DIAGNOSIS — I10 Essential (primary) hypertension: Secondary | ICD-10-CM

## 2020-06-24 LAB — LIPID PANEL
Cholesterol: 154 mg/dL (ref 0–200)
HDL: 38.7 mg/dL — ABNORMAL LOW (ref >39.00)
LDL Cholesterol: 94 mg/dL (ref 0–99)
NonHDL: 115.71
Total CHOL/HDL Ratio: 4
Triglycerides: 107 mg/dL (ref 0.0–149.0)
VLDL: 21.4 mg/dL (ref 0.0–40.0)

## 2020-06-24 LAB — COMPREHENSIVE METABOLIC PANEL
ALT: 10 U/L (ref 0–35)
AST: 14 U/L (ref 0–37)
Albumin: 3.9 g/dL (ref 3.5–5.2)
Alkaline Phosphatase: 60 U/L (ref 39–117)
BUN: 15 mg/dL (ref 6–23)
CO2: 30 mEq/L (ref 19–32)
Calcium: 9.5 mg/dL (ref 8.4–10.5)
Chloride: 106 mEq/L (ref 96–112)
Creatinine, Ser: 1.13 mg/dL (ref 0.40–1.20)
GFR: 57.29 mL/min — ABNORMAL LOW (ref >60.00)
Glucose, Bld: 89 mg/dL (ref 70–99)
Potassium: 4.1 mEq/L (ref 3.5–5.1)
Sodium: 144 mEq/L (ref 135–145)
Total Bilirubin: 0.5 mg/dL (ref 0.2–1.2)
Total Protein: 7 g/dL (ref 6.0–8.3)

## 2020-06-24 LAB — VITAMIN D 25 HYDROXY (VIT D DEFICIENCY, FRACTURES): VITD: 36.1 ng/mL (ref 30.00–100.00)

## 2020-06-24 LAB — POCT URINALYSIS DIPSTICK
Bilirubin, UA: NEGATIVE
Glucose, UA: NEGATIVE
Ketones, UA: NEGATIVE
Nitrite, UA: NEGATIVE
Protein, UA: POSITIVE — AB
Spec Grav, UA: 1.015 (ref 1.010–1.025)
Urobilinogen, UA: 0.2 E.U./dL
pH, UA: 6.5 (ref 5.0–8.0)

## 2020-06-24 LAB — CBC
HCT: 41.1 % (ref 36.0–46.0)
Hemoglobin: 13.6 g/dL (ref 12.0–15.0)
MCHC: 33 g/dL (ref 30.0–36.0)
MCV: 87.3 fl (ref 78.0–100.0)
Platelets: 310 10*3/uL (ref 150.0–400.0)
RBC: 4.7 Mil/uL (ref 3.87–5.11)
RDW: 15.1 % (ref 11.5–15.5)
WBC: 7.4 10*3/uL (ref 4.0–10.5)

## 2020-06-24 LAB — HEMOGLOBIN A1C: Hgb A1c MFr Bld: 5.3 % (ref 4.6–6.5)

## 2020-06-24 LAB — TSH: TSH: 2.04 u[IU]/mL (ref 0.35–4.50)

## 2020-06-24 MED ORDER — MIRABEGRON ER 50 MG PO TB24: 50.0000 mg | ORAL_TABLET | Freq: Every day | ORAL | 1 refills | Status: DC

## 2020-06-24 MED ORDER — NITROFURANTOIN MONOHYD MACRO 100 MG PO CAPS: 100.0000 mg | ORAL_CAPSULE | Freq: Two times a day (BID) | ORAL | 0 refills | Status: DC

## 2020-06-24 MED ORDER — NYSTATIN 100000 UNIT/GM EX CREA: 1.0000 "application " | TOPICAL_CREAM | Freq: Two times a day (BID) | CUTANEOUS | 3 refills | Status: DC

## 2020-06-24 NOTE — Assessment & Plan Note (Signed)
Checking vitamin D level and adjust as needed.  

## 2020-06-24 NOTE — Assessment & Plan Note (Signed)
Checking CMP and adjust lisinopril and metoprolol and amlodipine as needed. BP mildly high today and she decided against change given stress in her life.

## 2020-06-24 NOTE — Assessment & Plan Note (Signed)
Rx nystatin cream to use on stomach chronically.

## 2020-06-24 NOTE — Assessment & Plan Note (Signed)
U/A done in office consistent with infection. Rx macrobid.

## 2020-06-24 NOTE — Assessment & Plan Note (Signed)
Checking HgA1c and adjust as needed.  
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
## 2020-06-24 NOTE — Assessment & Plan Note (Signed)
Rx myrbetriq.
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
## 2020-06-24 NOTE — Assessment & Plan Note (Signed)
Checking TSH and adjust synthroid 50 mcg daily as needed. 

## 2020-06-24 NOTE — Patient Instructions (Addendum)
We will check the labs today.   We have sent in macrobid for the urinary infection to take 1 pill twice a day for 1 week.  We have also sent in the medicine for the bladder myrbetriq to take 1 pill daily. Give this a month to work well and let us know if the cost is too high.   We have sent in cream to use on the rash 2-3 times a day as needed.

## 2020-10-31 ENCOUNTER — Other Ambulatory Visit: Payer: Self-pay | Admitting: Family

## 2020-10-31 DIAGNOSIS — Z1231 Encounter for screening mammogram for malignant neoplasm of breast: Secondary | ICD-10-CM

## 2020-12-07 ENCOUNTER — Other Ambulatory Visit: Payer: Self-pay | Admitting: Internal Medicine

## 2020-12-07 DIAGNOSIS — I10 Essential (primary) hypertension: Secondary | ICD-10-CM

## 2020-12-07 DIAGNOSIS — E039 Hypothyroidism, unspecified: Secondary | ICD-10-CM

## 2020-12-18 ENCOUNTER — Telehealth: Payer: Self-pay | Admitting: Internal Medicine

## 2020-12-18 NOTE — Telephone Encounter (Signed)
   Patient wants to know why her medications were not refilled for 90 days. Requesting call from CMA Declined to schedule 6 month follow up

## 2020-12-23 NOTE — Telephone Encounter (Signed)
Called patient and spoke to her regarding her meds.  She stated theat she wasn't Told she needed to have a f/u or physical appt   Advised her that the notes states that she was.   Advised that was the reason for the 30 day fill on her meds.  Offered to make her an appt and she declined.  She said she would have to think about it and call back.  Dm/cma

## 2020-12-31 ENCOUNTER — Other Ambulatory Visit: Payer: Self-pay | Admitting: Internal Medicine

## 2020-12-31 DIAGNOSIS — E039 Hypothyroidism, unspecified: Secondary | ICD-10-CM

## 2020-12-31 DIAGNOSIS — I10 Essential (primary) hypertension: Secondary | ICD-10-CM

## 2021-01-30 ENCOUNTER — Other Ambulatory Visit: Payer: Self-pay

## 2021-01-30 ENCOUNTER — Encounter: Payer: Self-pay | Admitting: Internal Medicine

## 2021-01-30 ENCOUNTER — Ambulatory Visit (INDEPENDENT_AMBULATORY_CARE_PROVIDER_SITE_OTHER): Payer: Medicare Other | Admitting: Internal Medicine

## 2021-01-30 VITALS — BP 132/82 | HR 61 | Temp 98.3°F | Resp 18 | Ht 63.0 in | Wt 216.8 lb

## 2021-01-30 DIAGNOSIS — R002 Palpitations: Secondary | ICD-10-CM | POA: Diagnosis not present

## 2021-01-30 DIAGNOSIS — N3941 Urge incontinence: Secondary | ICD-10-CM

## 2021-01-30 DIAGNOSIS — Z0000 Encounter for general adult medical examination without abnormal findings: Secondary | ICD-10-CM

## 2021-01-30 DIAGNOSIS — I10 Essential (primary) hypertension: Secondary | ICD-10-CM

## 2021-01-30 DIAGNOSIS — E039 Hypothyroidism, unspecified: Secondary | ICD-10-CM | POA: Diagnosis not present

## 2021-01-30 DIAGNOSIS — F4323 Adjustment disorder with mixed anxiety and depressed mood: Secondary | ICD-10-CM | POA: Diagnosis not present

## 2021-01-30 LAB — COMPREHENSIVE METABOLIC PANEL
ALT: 10 U/L (ref 0–35)
AST: 14 U/L (ref 0–37)
Albumin: 3.8 g/dL (ref 3.5–5.2)
Alkaline Phosphatase: 63 U/L (ref 39–117)
BUN: 14 mg/dL (ref 6–23)
CO2: 32 mEq/L (ref 19–32)
Calcium: 9.3 mg/dL (ref 8.4–10.5)
Chloride: 105 mEq/L (ref 96–112)
Creatinine, Ser: 1.02 mg/dL (ref 0.40–1.20)
GFR: 54.96 mL/min — ABNORMAL LOW (ref >60.00)
Glucose, Bld: 84 mg/dL (ref 70–99)
Potassium: 4 mEq/L (ref 3.5–5.1)
Sodium: 143 mEq/L (ref 135–145)
Total Bilirubin: 0.4 mg/dL (ref 0.2–1.2)
Total Protein: 7.1 g/dL (ref 6.0–8.3)

## 2021-01-30 LAB — CBC
HCT: 41.5 % (ref 36.0–46.0)
Hemoglobin: 13.7 g/dL (ref 12.0–15.0)
MCHC: 32.9 g/dL (ref 30.0–36.0)
MCV: 86 fl (ref 78.0–100.0)
Platelets: 322 10*3/uL (ref 150.0–400.0)
RBC: 4.82 Mil/uL (ref 3.87–5.11)
RDW: 15.4 % (ref 11.5–15.5)
WBC: 6.8 10*3/uL (ref 4.0–10.5)

## 2021-01-30 LAB — T4, FREE: Free T4: 1.04 ng/dL (ref 0.60–1.60)

## 2021-01-30 LAB — TSH: TSH: 2 u[IU]/mL (ref 0.35–4.50)

## 2021-01-30 MED ORDER — AMLODIPINE BESYLATE 5 MG PO TABS: 5.0000 mg | ORAL_TABLET | Freq: Every day | ORAL | 3 refills | Status: DC

## 2021-01-30 MED ORDER — LISINOPRIL 40 MG PO TABS: 40.0000 mg | ORAL_TABLET | Freq: Every day | ORAL | 3 refills | Status: DC

## 2021-01-30 MED ORDER — LEVOTHYROXINE SODIUM 50 MCG PO TABS: ORAL_TABLET | ORAL | 3 refills | Status: DC

## 2021-01-30 MED ORDER — METOPROLOL SUCCINATE ER 100 MG PO TB24: 100.0000 mg | ORAL_TABLET | Freq: Two times a day (BID) | ORAL | 3 refills | Status: DC

## 2021-01-30 MED ORDER — OXYBUTYNIN CHLORIDE ER 5 MG PO TB24: 5.0000 mg | ORAL_TABLET | Freq: Every day | ORAL | 1 refills | Status: DC

## 2021-01-30 NOTE — Assessment & Plan Note (Signed)
Did try lexapro and did not like the way it made her feel and does not want to try similar today.

## 2021-01-30 NOTE — Assessment & Plan Note (Signed)
Did not try myrbetriq due to cost and will take off list. Try oxybutynin 5 mg daily to see if this help instead.

## 2021-01-30 NOTE — Assessment & Plan Note (Signed)
Checking TSH and free T4 and adjust synthroid 50 mcg daily as needed.  

## 2021-01-30 NOTE — Assessment & Plan Note (Signed)
Stable overall, on beta blocker. Discussed holter monitoring as this has not been done and she declines today.

## 2021-01-30 NOTE — Patient Instructions (Addendum)
We will try oxybutynin for the bladder to take once a day.  We are checking the labs today.  Make sure to get the booster shot go to vaccines.gov   Health Maintenance, Female Adopting a healthy lifestyle and getting preventive care are important in promoting health and wellness. Ask your health care provider about:  The right schedule for you to have regular tests and exams.  Things you can do on your own to prevent diseases and keep yourself healthy. What should I know about diet, weight, and exercise? Eat a healthy diet  Eat a diet that includes plenty of vegetables, fruits, low-fat dairy products, and lean protein.  Do not eat a lot of foods that are high in solid fats, added sugars, or sodium.   Maintain a healthy weight Body mass index (BMI) is used to identify weight problems. It estimates body fat based on height and weight. Your health care provider can help determine your BMI and help you achieve or maintain a healthy weight. Get regular exercise Get regular exercise. This is one of the most important things you can do for your health. Most adults should:  Exercise for at least 150 minutes each week. The exercise should increase your heart rate and make you sweat (moderate-intensity exercise).  Do strengthening exercises at least twice a week. This is in addition to the moderate-intensity exercise.  Spend less time sitting. Even light physical activity can be beneficial. Watch cholesterol and blood lipids Have your blood tested for lipids and cholesterol at 73 years of age, then have this test every 5 years. Have your cholesterol levels checked more often if:  Your lipid or cholesterol levels are high.  You are older than 73 years of age.  You are at high risk for heart disease. What should I know about cancer screening? Depending on your health history and family history, you may need to have cancer screening at various ages. This may include screening for:  Breast  cancer.  Cervical cancer.  Colorectal cancer.  Skin cancer.  Lung cancer. What should I know about heart disease, diabetes, and high blood pressure? Blood pressure and heart disease  High blood pressure causes heart disease and increases the risk of stroke. This is more likely to develop in people who have high blood pressure readings, are of African descent, or are overweight.  Have your blood pressure checked: ? Every 3-5 years if you are 66-32 years of age. ? Every year if you are 67 years old or older. Diabetes Have regular diabetes screenings. This checks your fasting blood sugar level. Have the screening done:  Once every three years after age 73 if you are at a normal weight and have a low risk for diabetes.  More often and at a younger age if you are overweight or have a high risk for diabetes. What should I know about preventing infection? Hepatitis B If you have a higher risk for hepatitis B, you should be screened for this virus. Talk with your health care provider to find out if you are at risk for hepatitis B infection. Hepatitis C Testing is recommended for:  Everyone born from [birth year] through 1963.  Anyone with known risk factors for hepatitis C. Sexually transmitted infections (STIs)  Get screened for STIs, including gonorrhea and chlamydia, if: ? You are sexually active and are younger than 73 years of age. ? You are older than 73 years of age and your health care provider tells you that you are at  risk for this type of infection. ? Your sexual activity has changed since you were last screened, and you are at increased risk for chlamydia or gonorrhea. Ask your health care provider if you are at risk.  Ask your health care provider about whether you are at high risk for HIV. Your health care provider may recommend a prescription medicine to help prevent HIV infection. If you choose to take medicine to prevent HIV, you should first get tested for HIV. You should  then be tested every 3 months for as long as you are taking the medicine. Pregnancy  If you are about to stop having your period (premenopausal) and you may become pregnant, seek counseling before you get pregnant.  Take 400 to 800 micrograms (mcg) of folic acid every day if you become pregnant.  Ask for birth control (contraception) if you want to prevent pregnancy. Osteoporosis and menopause Osteoporosis is a disease in which the bones lose minerals and strength with aging. This can result in bone fractures. If you are 89 years old or older, or if you are at risk for osteoporosis and fractures, ask your health care provider if you should:  Be screened for bone loss.  Take a calcium or vitamin D supplement to lower your risk of fractures.  Be given hormone replacement therapy (HRT) to treat symptoms of menopause. Follow these instructions at home: Lifestyle  Do not use any products that contain nicotine or tobacco, such as cigarettes, e-cigarettes, and chewing tobacco. If you need help quitting, ask your health care provider.  Do not use street drugs.  Do not share needles.  Ask your health care provider for help if you need support or information about quitting drugs. Alcohol use  Do not drink alcohol if: ? Your health care provider tells you not to drink. ? You are pregnant, may be pregnant, or are planning to become pregnant.  If you drink alcohol: ? Limit how much you use to 0-1 drink a day. ? Limit intake if you are breastfeeding.  Be aware of how much alcohol is in your drink. In the [place] one drink equals one 12 oz bottle of beer (355 mL), one 5 oz glass of wine (148 mL), or one 1 oz glass of hard liquor (44 mL). General instructions  Schedule regular health, dental, and eye exams.  Stay current with your vaccines.  Tell your health care provider if: ? You often feel depressed. ? You have ever been abused or do not feel safe at home. Summary  Adopting a  healthy lifestyle and getting preventive care are important in promoting health and wellness.  Follow your health care provider's instructions about healthy diet, exercising, and getting tested or screened for diseases.  Follow your health care provider's instructions on monitoring your cholesterol and blood pressure. This information is not intended to replace advice given to you by your health care provider. Make sure you discuss any questions you have with your health care provider. Document Revised: 11/29/2018 Document Reviewed: 11/29/2018 Elsevier Patient Education  2021 [employer].

## 2021-01-30 NOTE — Assessment & Plan Note (Signed)
Flu shot up to date. Covid-19 2 shots recommended booster given info on how to find and schedule. Pneumonia complete. Shingrix counseled. Tetanus due 2029. Colonoscopy due and we talked extensively about how her colonoscopy 2020 was not complete and she was recommended virtual colonoscopy due to hernia with bowel but she did not do this. Mammogram due and ordered but she has not scheduled yet, pap smear aged out and dexa declines. Counseled about sun safety and mole surveillance. Counseled about the dangers of distracted driving. Given 10 year screening recommendations.

## 2021-01-30 NOTE — Assessment & Plan Note (Signed)
BP at goal and taking amlodipine and lisinopril and metoprolol. Checking CMP and adjust as needed. Refilled all today.

## 2021-01-30 NOTE — Progress Notes (Signed)
Subjective:   Patient ID: Frances [name], female    DOB: [birth year], 75 y.o.   MRN: [id number]  HPI Here for medicare wellness and physical, no new complaints. Please see A/P for status and treatment of chronic medical problems.   Diet: heart healthy Physical activity: sedentary Depression/mood screen: negative Hearing: intact to whispered voice Visual acuity: not great even with lens, overdue for annual eye exam  ADLs: capable Fall risk: none Home safety: good Cognitive evaluation: intact to orientation, naming, recall and repetition EOL planning: adv directives discussed  Flowsheet Row Office Visit from 01/30/2021 in [facility] at [employer]  PHQ-2 Total Score 6      Flowsheet Row Office Visit from 01/30/2021 in [facility]  PHQ-9 Total Score 20     I have personally reviewed and have noted 1. The patient's medical and social history - reviewed today no changes 2. Their use of alcohol, tobacco or illicit drugs 3. Their current medications and supplements 4. The patient's functional ability including ADL's, fall risks, home safety risks and hearing or visual impairment. 5. Diet and physical activities 6. Evidence for depression or mood disorders 7. Care team reviewed and updated  Patient Care Team: Myrlene Broker, MD as PCP - General (Internal Medicine) Hilarie Fredrickson, MD as Consulting Physician (Gastroenterology) Past Medical History:  Diagnosis Date  . Anxiety   . Arthritis   . Blood in stool   . Depression   . GERD (gastroesophageal reflux disease)   . Hypertension   . Sleep apnea   . Thyroid disease   . UTI (urinary tract infection)    Past Surgical History:  Procedure Laterality Date  . HERNIA REPAIR     umbilical repair   Family History  Problem Relation Age of Onset  . Arthritis Mother   . Alcohol abuse Father   . Arthritis Father   . Stroke Father   . Arthritis Maternal Grandmother   . Stroke Maternal  Grandmother   . Arthritis Maternal Grandfather   . Arthritis Paternal Grandmother   . Arthritis Paternal Grandfather   . Colon cancer Neg Hx   . Esophageal cancer Neg Hx   . Rectal cancer Neg Hx   . Stomach cancer Neg Hx     Review of Systems  Constitutional: Negative.   HENT: Negative.   Eyes: Negative.   Respiratory: Negative for cough, chest tightness and shortness of breath.   Cardiovascular: Negative for chest pain, palpitations and leg swelling.  Gastrointestinal: Negative for abdominal distention, abdominal pain, constipation, diarrhea, nausea and vomiting.  Genitourinary: Positive for frequency and urgency.  Musculoskeletal: Positive for myalgias.  Skin: Negative.   Neurological: Negative.   Psychiatric/Behavioral: Negative.     Objective:  Physical Exam Constitutional:      Appearance: She is well-developed and well-nourished. She is obese.  HENT:     Head: Normocephalic and atraumatic.  Eyes:     Extraocular Movements: EOM normal.  Cardiovascular:     Rate and Rhythm: Normal rate and regular rhythm.  Pulmonary:     Effort: Pulmonary effort is normal. No respiratory distress.     Breath sounds: Normal breath sounds. No wheezing or rales.  Abdominal:     General: Bowel sounds are normal. There is no distension.     Palpations: Abdomen is soft.     Tenderness: There is no abdominal tenderness. There is no rebound.     Comments: Minimal diffuse tenderness esp LUQ  Musculoskeletal:  General: No edema.     Cervical back: Normal range of motion.  Skin:    General: Skin is warm and dry.  Neurological:     Mental Status: She is alert and oriented to person, place, and time.     Coordination: Coordination normal.  Psychiatric:        Mood and Affect: Mood and affect normal.     Vitals:   01/30/21 0931  BP: 132/82  Pulse: 61  Resp: 18  Temp: 98.3 F (36.8 C)  TempSrc: Oral  SpO2: 99%  Weight: 216 lb 12.8 oz (98.3 kg)  Height: 5\' 3"  (1.6 m)    This visit occurred during the SARS-CoV-2 public health emergency.  Safety protocols were in place, including screening questions prior to the visit, additional usage of staff PPE, and extensive cleaning of exam room while observing appropriate contact time as indicated for disinfecting solutions.   Assessment & Plan:

## 2021-02-02 ENCOUNTER — Ambulatory Visit: Payer: Medicare Other | Admitting: Internal Medicine

## 2021-02-02 IMAGING — CT CT ABDOMEN AND PELVIS WITH CONTRAST
2 of 5 series · 16 of 46 positions shown, 18 images · IV contrast (OMNIPAQUE)
Comparison: None.

CLINICAL DATA: Bloating and constipation. Colon cancer screening.
Incomplete colonoscopy.

EXAM:
CT ABDOMEN AND PELVIS WITH CONTRAST
TECHNIQUE: Multidetector CT imaging of the abdomen and pelvis was performed
using the standard protocol following bolus administration of
intravenous contrast.
CONTRAST:  100mL OMNIPAQUE IOHEXOL 300 MG/ML  SOLN

[Series 2: axial st · axial · 0.79mm/px · z∈[-561,-231]mm · 13 of 78 slices shown, 15 images]
[im 6/78  soft-tissue]
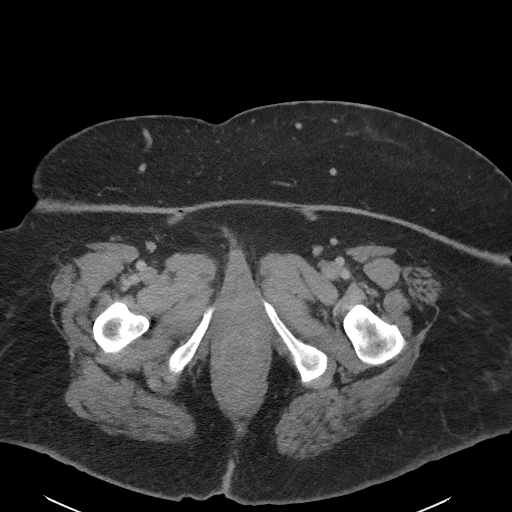
[im 6/78  bone]
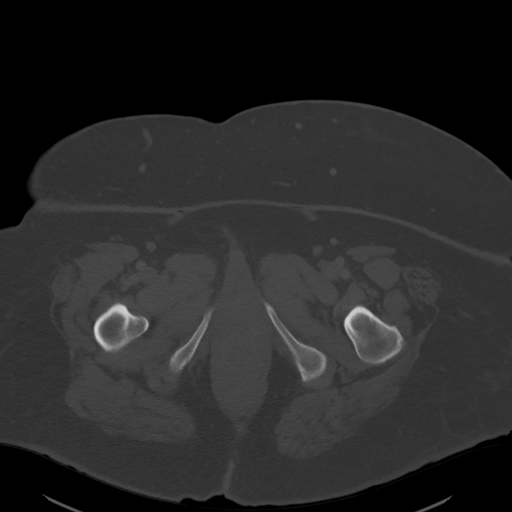
[im 11/78  soft-tissue]
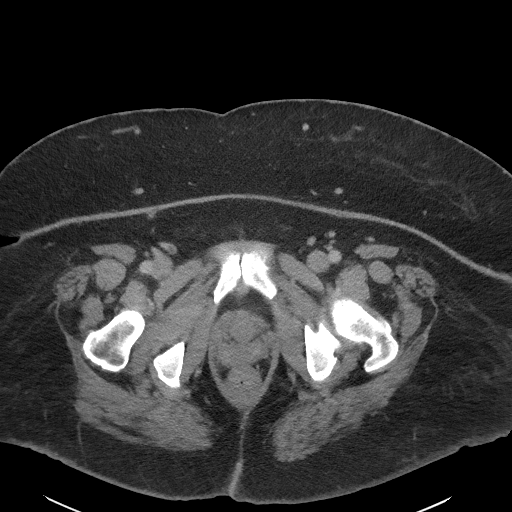
[im 16/78  soft-tissue]
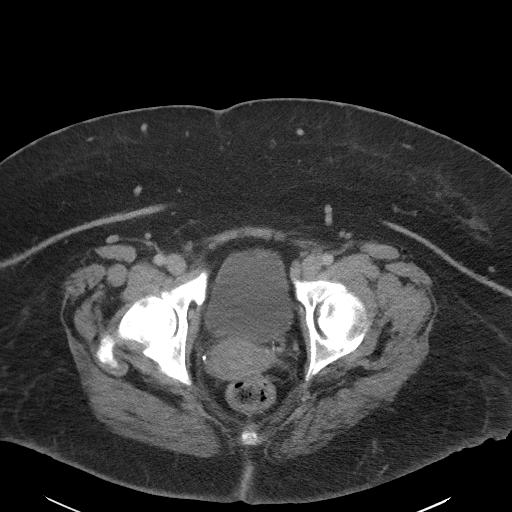
[im 21/78  soft-tissue]
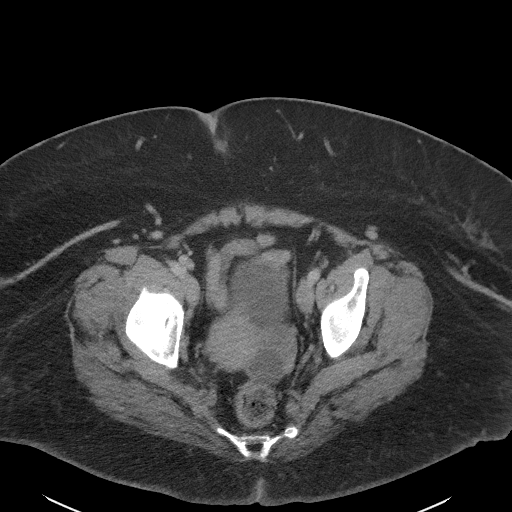
[im 26/78  soft-tissue]
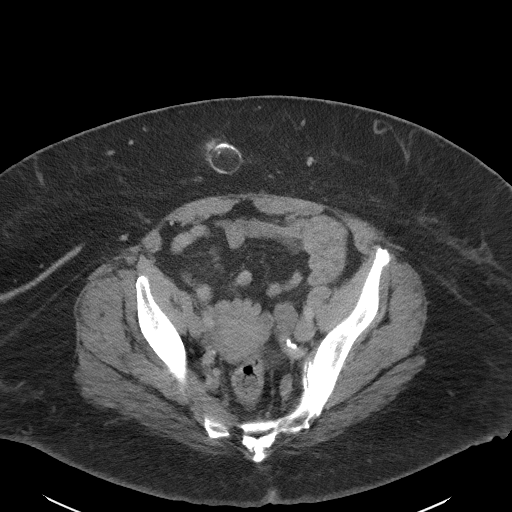
[im 31/78  soft-tissue]
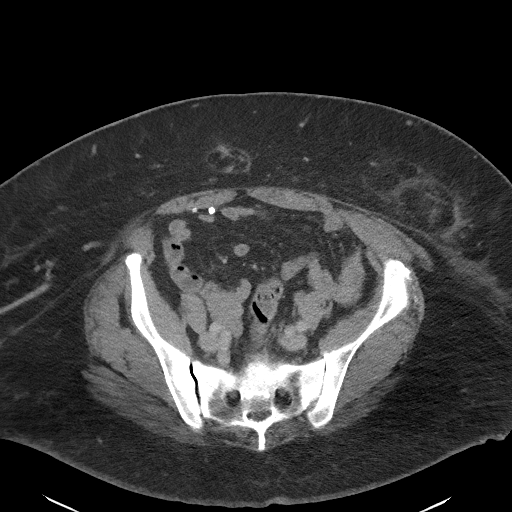
[im 42/78  soft-tissue]
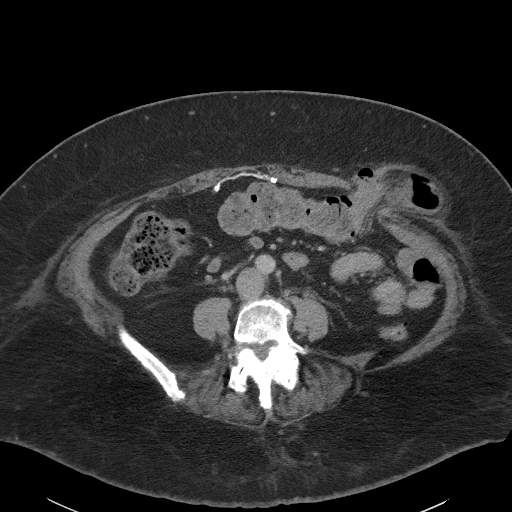
[im 47/78  soft-tissue]
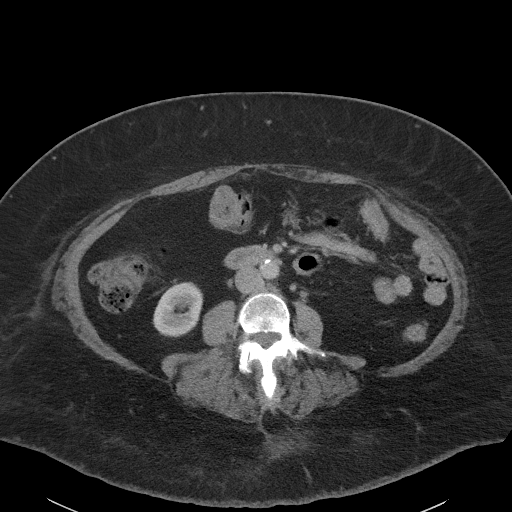
[im 52/78  soft-tissue]
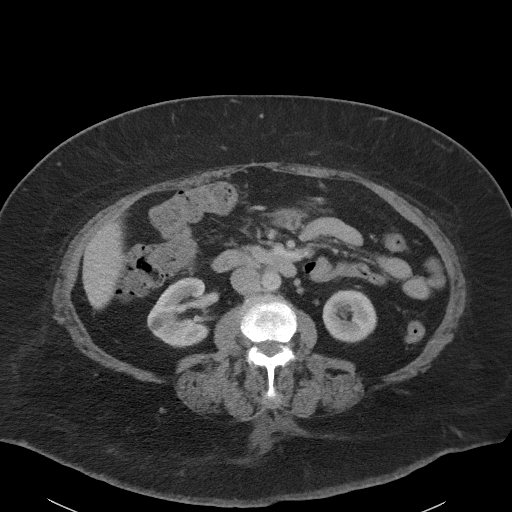
[im 52/78  bone]
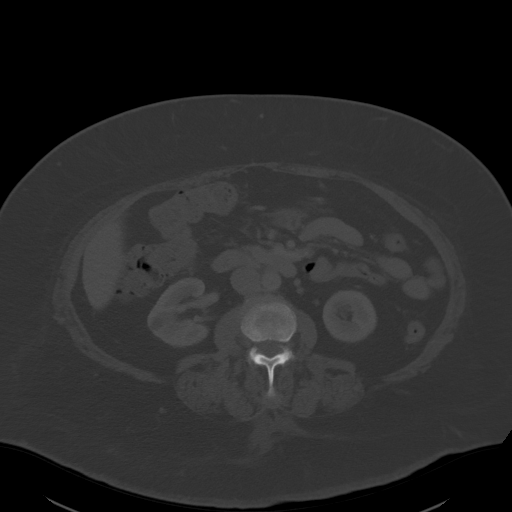
[im 57/78  soft-tissue]
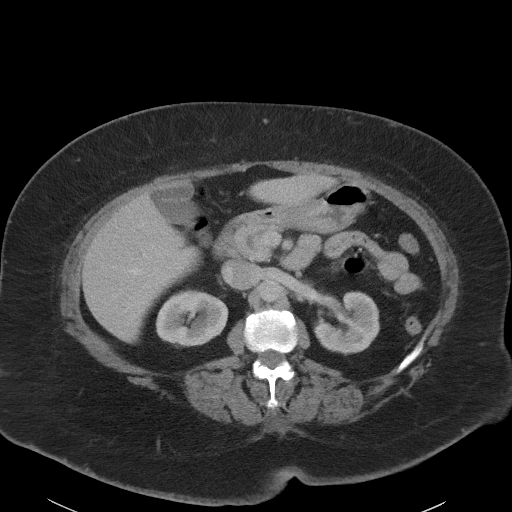
[im 62/78  soft-tissue]
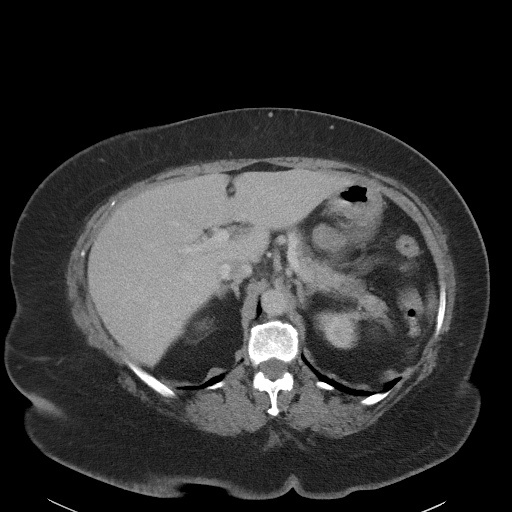
[im 67/78  soft-tissue]
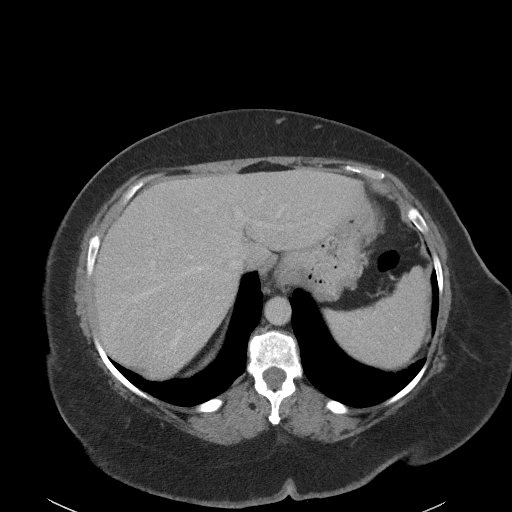
[im 72/78  soft-tissue]
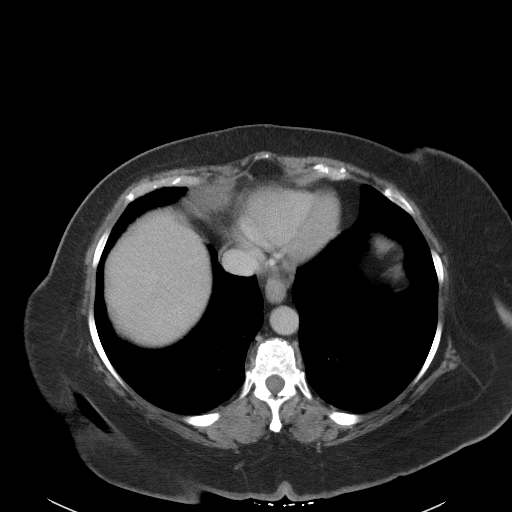

[Series 4: coronal st · coronal · 0.86mm/px · 3 of 87 slices shown]
[im 29/87  soft-tissue]
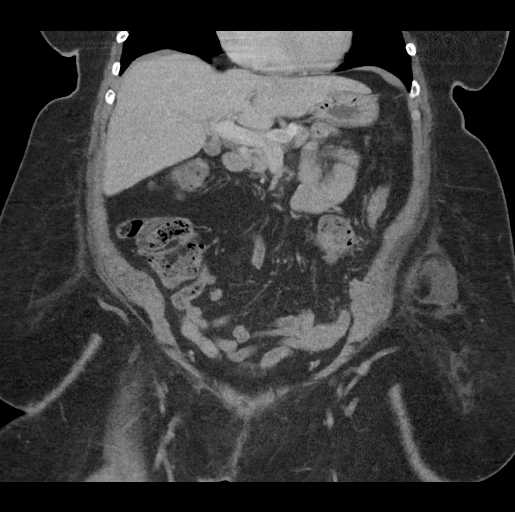
[im 39/87  soft-tissue]
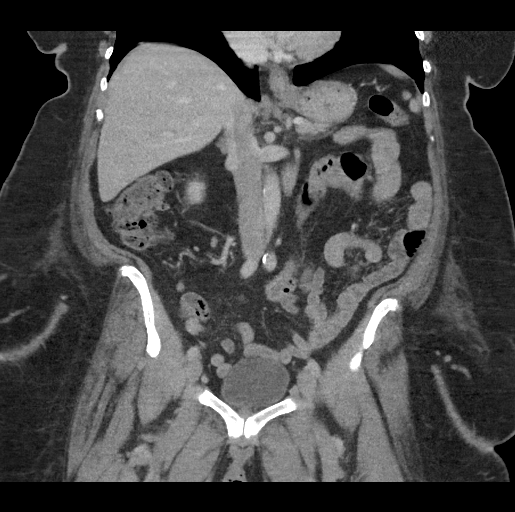
[im 48/87  soft-tissue]
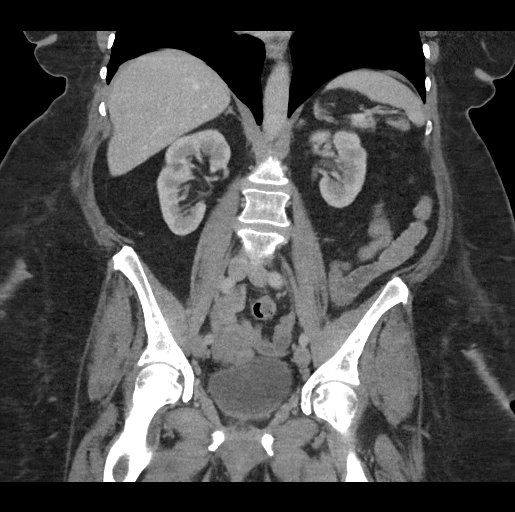

[16 of 46 positions shown; findings below may reference images not displayed]

FINDINGS: Lower chest: No acute abnormality.

Hepatobiliary: No focal liver abnormality is seen. No gallstones,
gallbladder wall thickening, or biliary dilatation.

Pancreas: Unremarkable. No pancreatic ductal dilatation or
surrounding inflammatory changes.

Spleen: Normal in size without focal abnormality.

Adrenals/Urinary Tract: Normal appearance of the adrenal glands.
There are 2 low attenuation structures within the right kidney. Both
less than 1 cm and too small to reliably characterize. No
hydronephrosis identified bilaterally. The urinary bladder appears
unremarkable.

Stomach/Bowel: Stomach appears normal. The small bowel loops are
normal in caliber. No evidence of bowel wall thickening or
inflammation. The appendix is visualized and appears normal.
Moderate stool burden identified throughout the colon up to the
rectum. No abnormal large bowel dilatation. Left lower quadrant
ventral abdominal wall hernia contains a nonobstructed loop of
distal transverse colon and a small volume of ascites, image 42/2.
No large obstructing mass identified. Study not diagnostic for the
purposes of colon cancer screening.

Vascular/Lymphatic: Aortic atherosclerosis. No aneurysm. No
abdominal or pelvic adenopathy.

Reproductive: Uterus unremarkable. There is asymmetric solid and
cystic enlargement of the right ovary which measures 3.0 x 5.8 by
5.2 cm. No right adnexal mass.

Other: No free fluid or fluid collections. Previous ventral
abdominal wall herniorrhaphy at the level of the umbilicus.

Musculoskeletal: Lumbar spondylosis identified. No acute or
suspicious bone abnormalities. Anterolisthesis of L4 on L5 is
identified.
IMPRESSION: 1. Examination is not diagnostic for the purposes of colon cancer
screening.
2. No mass or adenopathy identified within the abdomen or pelvis.
3. Left lower quadrant ventral abdominal wall hernia contains a
nonobstructed loop of distal transverse colon and a small amount of
fluid.
4. Asymmetric enlargement of the left ovary with cystic component.
Indeterminate. Suggest further characterization with pelvic
sonogram.

## 2021-05-11 ENCOUNTER — Telehealth: Payer: Self-pay | Admitting: Internal Medicine

## 2021-05-11 NOTE — Progress Notes (Signed)
error 

## 2021-05-11 NOTE — Progress Notes (Signed)
  Chronic Care Management   Note  05/11/2021 Name: Anysa Tacey MRN: [id number] DOB: 10-05-1946  Marlow Hendrie is a 75 y.o. year old female who is a primary care patient of Myrlene Broker, MD. I reached out to Jenean Lindau by phone today in response to a referral sent by Ms. Steward Drone Lupe's PCP, Myrlene Broker, MD.   Ms. Gopal was given information about Chronic Care Management services today including:  1. CCM service includes personalized support from designated clinical staff supervised by her physician, including individualized plan of care and coordination with other care providers 2. 24/7 contact phone numbers for assistance for urgent and routine care needs. 3. Service will only be billed when office clinical staff spend 20 minutes or more in a month to coordinate care. 4. Only one practitioner may furnish and bill the service in a calendar month. 5. The patient may stop CCM services at any time (effective at the end of the month) by phone call to the office staff.   Patient agreed to services and verbal consent obtained.   Follow up plan:   Alvie Heidelberg [employer] Scheduler

## 2021-06-09 ENCOUNTER — Other Ambulatory Visit: Payer: Self-pay

## 2021-06-09 ENCOUNTER — Ambulatory Visit (INDEPENDENT_AMBULATORY_CARE_PROVIDER_SITE_OTHER): Payer: Medicare Other

## 2021-06-09 DIAGNOSIS — E039 Hypothyroidism, unspecified: Secondary | ICD-10-CM

## 2021-06-09 DIAGNOSIS — I10 Essential (primary) hypertension: Secondary | ICD-10-CM

## 2021-06-09 DIAGNOSIS — N3941 Urge incontinence: Secondary | ICD-10-CM

## 2021-06-09 DIAGNOSIS — R002 Palpitations: Secondary | ICD-10-CM

## 2021-06-09 NOTE — Progress Notes (Signed)
 Chronic Care Management Pharmacy Note  06/09/2021 Name:  Frances [name] MRN:  [id number] DOB:  07/14/1946  Summary: - Patient reports to left leg/ hip pain that had started about 1 year ago, but seems to be increasing particularly at night  - Patient BP in office under good control, has not been checking at home, reports that palpitations/ heart racing that she has been experiencing for >1 year continue - declines chest pains / issues breathing  - Reports that she had not started oxybutynin that was ordered with last visit as she was unaware as to what medication was for  Recommendations/Changes made from today's visit: -Recommended for patient to start taking oxybutynin as prescribed, patient also to decrease soda/caffeine intake to help improve urinary symptoms  - Patient to follow up with provider about left sided leg and hip pain as well as heart palpitations/ racing heart beat  - Patient agreeable to complete DEXA scan that she is due for/ has never completed   Subjective: Frances [name] is an 74 y.o. year old female who is a primary patient of Crawford, Elizabeth A, MD.  The CCM team was consulted for assistance with disease management and care coordination needs.    Engaged with patient face to face for initial visit in response to provider referral for pharmacy case management and/or care coordination services.   Consent to Services:  The patient was given the following information about Chronic Care Management services today, agreed to services, and gave verbal consent: 1. CCM service includes personalized support from designated clinical staff supervised by the primary care provider, including individualized plan of care and coordination with other care providers 2. 24/7 contact phone numbers for assistance for urgent and routine care needs. 3. Service will only be billed when office clinical staff spend 20 minutes or more in a month to coordinate care. 4. Only one practitioner may  furnish and bill the service in a calendar month. 5.The patient may stop CCM services at any time (effective at the end of the month) by phone call to the office staff. 6. The patient will be responsible for cost sharing (co-pay) of up to 20% of the service fee (after annual deductible is met). Patient agreed to services and consent obtained.  Patient Care Team: Crawford, Elizabeth A, MD as PCP - General (Internal Medicine) Perry, John N, MD as Consulting Physician (Gastroenterology) Szabat, Daniel C, RPH as Pharmacist (Pharmacist)  Recent office visits: 01/30/2021 - PCP visit - medicare wellness and physical - starting oxybutynin - myrbetriq not started due to cost  06/24/2020 - PCP visit-  concerns for possible UTI - given macrobid rx, along with myrbetriq and nystatin prescriptions   Recent consult visits: N/a  Hospital visits: None in previous 6 months  Objective:  Lab Results  Component Value Date   CREATININE 1.02 01/30/2021   BUN 14 01/30/2021   GFR 54.96 (L) 01/30/2021   GFRNONAA 60 (L) 01/19/2011   GFRAA  01/19/2011    >60        The eGFR has been calculated using the MDRD equation. This calculation has not been validated in all clinical situations. eGFR's persistently <60 mL/min signify possible Chronic Kidney Disease.   NA 143 01/30/2021   K 4.0 01/30/2021   CALCIUM 9.3 01/30/2021   CO2 32 01/30/2021   GLUCOSE 84 01/30/2021    Lab Results  Component Value Date/Time   HGBA1C 5.3 06/24/2020 11:50 AM   HGBA1C 5.3 09/25/2018 10:40 AM   GFR 54.96 (L)   01/30/2021 10:18 AM   GFR 57.29 (L) 06/24/2020 11:50 AM    Last diabetic Eye exam:  No results found for: HMDIABEYEEXA  Last diabetic Foot exam:  No results found for: HMDIABFOOTEX   Lab Results  Component Value Date   CHOL 154 06/24/2020   HDL 38.70 (L) 06/24/2020   LDLCALC 94 06/24/2020   TRIG 107.0 06/24/2020   CHOLHDL 4 06/24/2020    Hepatic Function Latest Ref Rng & Units 01/30/2021 06/24/2020 01/05/2019   Total Protein 6.0 - 8.3 g/dL 7.1 7.0 7.2  Albumin 3.5 - 5.2 g/dL 3.8 3.9 4.0  AST 0 - 37 U/L _0 ALT 0 - 35 U/L _1 Alk Phosphatase 39 - 117 U/L 63 60 53  Total Bilirubin 0.2 - 1.2 mg/dL 0.4 0.5 0.6  Bilirubin, Direct 0.0 - 0.3 mg/dL - - -    Lab Results  Component Value Date/Time   TSH 2.00 01/30/2021 10:18 AM   TSH 2.04 06/24/2020 11:50 AM   FREET4 1.04 01/30/2021 10:18 AM   FREET4 1.08 09/25/2018 10:40 AM    CBC Latest Ref Rng & Units 01/30/2021 06/24/2020 01/05/2019  WBC 4.0 - 10.5 K/uL 6.8 7.4 7.8  Hemoglobin 12.0 - 15.0 g/dL 13.7 13.6 14.3  Hematocrit 36.0 - 46.0 % 41.5 41.1 42.2  Platelets 150.0 - 400.0 K/uL 322.0 310.0 383.0    Lab Results  Component Value Date/Time   VD25OH 36.10 06/24/2020 11:50 AM    Clinical ASCVD: No  The 10-year ASCVD risk score Mikey Bussing DC Jr., et al., 2013) is: 10.3%   Values used to calculate the score:     Age: 5 years     Sex: Female     Is Non-Hispanic African American: Yes     Diabetic: No     Tobacco smoker: No     Systolic Blood Pressure: 503 mmHg     Is BP treated: Yes     HDL Cholesterol: 38.7 mg/dL     Total Cholesterol: 154 mg/dL    Depression screen [facility] 2/9 01/30/2021 10/31/2019 09/25/2018  Decreased Interest 3 0 0  Down, Depressed, Hopeless _2 PHQ - 2 Score _3 Altered sleeping 3 1 -  Tired, decreased energy 3 1 -  Change in appetite 1 1 -  Feeling bad or failure about yourself  3 0 -  Trouble concentrating 3 0 -  Moving slowly or fidgety/restless 1 0 -  Suicidal thoughts 0 0 -  PHQ-9 Score 20 5 -  Difficult doing work/chores - Somewhat difficult -    Social History   Tobacco Use  Smoking Status Never  Smokeless Tobacco Never   BP Readings from Last 3 Encounters:  01/30/21 132/82  06/24/20 (!) 144/92  02/01/19 126/68   Pulse Readings from Last 3 Encounters:  01/30/21 61  06/24/20 71  02/01/19 62   Wt Readings from Last 3 Encounters:  01/30/21 216 lb 12.8 oz (98.3 kg)  06/24/20 222 lb  (100.7 kg)  07/03/19 225 lb (102.1 kg)   BMI Readings from Last 3 Encounters:  01/30/21 38.40 kg/m  06/24/20 39.33 kg/m  07/03/19 39.86 kg/m    Assessment/Interventions: Review of patient past medical history, allergies, medications, health status, including review of consultants reports, laboratory and other test data, was performed as part of comprehensive evaluation and provision of chronic care management services.   SDOH:  (Social Determinants of Health) assessments and interventions performed: Yes  SDOH Screenings  Alcohol Screen: Not on file  Depression (PHQ2-9): Medium Risk   PHQ-2 Score: 20  Financial Resource Strain: Low Risk    Difficulty of Paying Living Expenses: Not hard at all  Food Insecurity: Not on file  Housing: Not on file  Physical Activity: Not on file  Social Connections: Not on file  Stress: Not on file  Tobacco Use: Low Risk    Smoking Tobacco Use: Never   Smokeless Tobacco Use: Never  Transportation Needs: Not on file    [place]  No Known Allergies  Medications Reviewed Today     Reviewed by Tomasa Blase, [facility] (Pharmacist) on 06/09/21 at 1312  Med List Status: <None>   Medication Order Taking? Sig Documenting Provider Last Dose Status Informant  amLODipine (NORVASC) 5 MG tablet [id number] Yes Take 1 tablet (5 mg total) by mouth daily. Hoyt Koch, MD Taking Active   levothyroxine (SYNTHROID) 50 MCG tablet [id number] Yes TAKE 1 TABLET BY MOUTH EVERY DAY BEFORE BREAKFAST Hoyt Koch, MD Taking Active   lisinopril (ZESTRIL) 40 MG tablet [id number] Yes Take 1 tablet (40 mg total) by mouth daily. Hoyt Koch, MD Taking Active   metoprolol succinate (TOPROL-XL) 100 MG 24 hr tablet [id number] Yes Take 1 tablet (100 mg total) by mouth 2 (two) times daily. Hoyt Koch, MD Taking Active   naproxen sodium (ALEVE) 220 MG tablet [id number] Yes Take 220 mg by mouth 2 (two) times daily as needed. [provider] Taking Active   nystatin cream (MYCOSTATIN) [id number] No Apply 1 application topically 2 (two) times daily.  Patient not taking: Reported on 06/09/2021   Hoyt Koch, MD Not Taking Active   oxybutynin (DITROPAN-XL) 5 MG 24 hr tablet [id number] No Take 1 tablet (5 mg total) by mouth at bedtime.  Patient not taking: Reported on 06/09/2021   Hoyt Koch, MD Not Taking Active   polyethylene glycol [facility] / GLYCOLAX) packet [id number] Yes Take 17 g by mouth daily as needed. [provider] Taking Active   Salicylic Acid 2 % PADS [id number] Yes Apply topically daily. [provider] Taking Active             Patient Active Problem List   Diagnosis Date Noted   Urge incontinence 06/24/2020   Candidal skin infection 06/24/2020   Adjustment disorder 06/24/2020   Routine general medical examination at a health care facility 09/25/2018   Palpitations 02/28/2018   Hyperglycemia 06/30/2017   OSA (obstructive sleep apnea) 07/13/2016   Chronic venous insufficiency 12/17/2015   Dry eyes 09/16/2015   Dental disease 09/16/2015   Gout 07/01/2015   Essential hypertension 07/01/2015   Avitaminosis D 07/01/2015   Hypothyroidism 07/01/2015   Constipation 02/16/2007    Immunization History  Administered Date(s) Administered   Influenza, High Dose Seasonal PF 10/27/2016, 09/21/2017, 09/19/2018, 09/17/2019, 09/17/2019, 10/03/2020   Influenza,inj,Quad PF,6+ Mos 12/17/2015   Influenza-Unspecified 09/19/2018, 09/17/2019   PFIZER(Purple Top)SARS-COV-2 Vaccination 01/08/2020, 01/29/2020   Pneumococcal Conjugate-13 12/17/2015   Pneumococcal Polysaccharide-23 09/21/2017   Tdap 03/20/2010, 09/25/2018    Conditions to be addressed/monitored:  Hypertension, Hypothyroidism, and Overactive Bladder  Care Plan : CCM Care Plan  Updates made by Tomasa Blase, [place] since 06/09/2021 12:00 AM     Problem: HTN, OAB, Hypothyroidism   Priority: High  Onset  Date: 06/09/2021     Long-Range Goal: Disease Management   Start Date: 06/09/2021  Expected End Date: 12/09/2021  This Visit's Progress: On track  Priority: High  Note:   Current Barriers:  Unable to independently monitor therapeutic efficacy Does not adhere to prescribed medication regimen  Pharmacist Clinical Goal(s):  Patient will achieve adherence to monitoring guidelines and medication adherence to achieve therapeutic efficacy maintain control of blood pressure as evidenced by blood pressure logs  adhere to prescribed medication regimen as evidenced by 0 missed doses of prescribed medications  contact provider office for questions/concerns as evidenced notation of same in electronic health record through collaboration with PharmD and provider.   Interventions: 1:1 collaboration with Crawford, Elizabeth A, MD regarding development and update of comprehensive plan of care as evidenced by provider attestation and co-signature Inter-disciplinary care team collaboration (see longitudinal plan of care) Comprehensive medication review performed; medication list updated in electronic medical record  Hypertension (BP goal <140/90) -Controlled -Current treatment: Amlodipine 5mg daily  Lisinopril 40mg daily  Metoprolol succinate 100mg twice daily  -Medications previously tried: furosemide   - Reports to heart palpitations (racing heart beat) about 3 times a week, chest heaviness at times, denies chest pains - reports that this has been ongoing for >6 months  -Current home readings: n/a - does not have blood pressure cuff at this time  -Current dietary habits: sodas - orange crush or sprite - 2-4 12oz cans throughout the day  -Current exercise habits: limited - recent left leg pain - uses walker to ambulate  -Denies hypotensive/hypertensive symptoms -Educated on BP goals and benefits of medications for prevention of heart attack, stroke and kidney damage; Daily salt intake goal < 2300  mg; Importance of home blood pressure monitoring; Proper BP monitoring technique; Symptoms of hypotension and importance of maintaining adequate hydration; -Counseled to monitor BP at home at least once weekly, document, and provide log at future appointments -Recommended to continue current medication Recommended for patient to schedule follow up appointment to discuss palpitations (reports that these have been ongoing for >1 year - no recent worsening or issues with chest pains/ trouble breathing   Hypothyroidism (Goal: Maintenance of euthyroid levels) -Controlled - Last TSH level - 2.00 uIU/mL (01/30/2021) - Last Free T4 level - 1.04 ng/dL (01/30/2021) -Current treatment  Levothyroxine 50mcg daily  -Medications previously tried: n/a  -Recommended to continue current medication  Urge Incontinence (Goal: Maintain / gain control of urinary symptoms) -Not ideally controlled -Current treatment  Oxybutynin XL 5mg daily  -Medications previously tried: myrbetriq (did not start due to cost)  - Current Fluid Intake Habits: reports to drinking 2-4 sodas daily  -Counseled on effects of caffeine that could be the reason for increased urge / nocturia - advised for patient to avoid caffeinated beverages/ reduce intake  Recommended for patient to start taking oxybutynin for her overactive bladder   Health Maintenance -Vaccine gaps: Shingles / COVID booster  -Current therapy:  Miralax - 17g daily as needed  Nystatin cream - applied twice daily to stomach  Naproxen 220mg - 1 tablet twice daily if needed  Noxzema (salicylic acid 2%)- applied to face daily  -Patient is satisfied with current therapy and denies issues - Patient notes that at times feels that face can feel dry and itchy - notes she does not rinse face after using noxzema - advised for patient to rinse face with water following application / use daily moisturizing cream on her face  -Patient notes to athletes foot at times that causes  itching - advised for use of OTC lotromin / keeping feet dry - use of gold bond foot powder  -Recommended to continue current medication    Patient Goals/Self-Care Activities Patient will:  - take medications as prescribed focus on medication adherence by taking all prescribed medications as directed check blood pressure once weekly, document, and provide at future appointments engage in dietary modifications by reducing caffeine intake throughout the day  Follow Up Plan: Telephone follow up appointment with care management team member scheduled for: The patient has been provided with contact information for the care management team and has been advised to call with any health related questions or concerns.  Follow up with provider re: left leg pain, continued ongoing heart palpitations        Medication Assistance: None required.  Patient affirms current coverage meets needs.  Compliance/Adherence/Medication fill history: Care Gaps: DEXA Scan  Patient's preferred pharmacy is:  [id number] [place] [address]Phone: [phone]: [phone]  Uses pill box? No - patient keeps medications in her bottles  Pt endorses 75-80% compliance  Care Plan and Follow Up Patient Decision:  Patient agrees to Care Plan and Follow-up.  Plan: Telephone follow up appointment with care management team member scheduled for:  2 months, The patient has been provided with contact information for the care management team and has been advised to call with any health related questions or concerns. , and Follow up with provider re: to discuss recent left leg pain, continued palpitations at times, and to schedule dexa scan  DTomasa Blase PharmD Clinical Pharmacist, [place]

## 2021-06-09 NOTE — Patient Instructions (Signed)
Visit Information   PATIENT GOALS:   Goals Addressed             This Visit's Progress    Manage Diet-Urinary Incontinence       Timeframe:  Long-Range Goal Priority:  Medium Start Date:  06/09/2021                           Expected End Date: 12/09/2021                      Follow Up Date 08/08/2021   - cut down caffeine use - decrease fizzy drinks (like soda pop) to one a day - drink 6 to 8 glasses of water each day - stop or limit fluids 2 hours before bedtime    Why is this important?   Some foods and drinks may cause your bladder to be "irritable".  Try to stop or cut back on these things and see if your symptoms improve.  Trying not to drink very much for 2 or 3 hours before bed may help too.         Manage My Medicine       Timeframe:  Long-Range Goal Priority:  High Start Date:  06/09/2021                          Expected End Date:  12/09/2021                     Follow Up Date 8/20/202    - call for medicine refill 2 or 3 days before it runs out - keep a list of all the medicines I take; vitamins and herbals too - learn to read medicine labels - use a pillbox to sort medicine - use an alarm clock or phone to remind me to take my medicine    Why is this important?   These steps will help you keep on track with your medicines.           Consent to CCM Services: Ms. Whitfill was given information about Chronic Care Management services today including:  CCM service includes personalized support from designated clinical staff supervised by her physician, including individualized plan of care and coordination with other care providers 24/7 contact phone numbers for assistance for urgent and routine care needs. Service will only be billed when office clinical staff spend 20 minutes or more in a month to coordinate care. Only one practitioner may furnish and bill the service in a calendar month. The patient may stop CCM services at any time (effective at the  end of the month) by phone call to the office staff. The patient will be responsible for cost sharing (co-pay) of up to 20% of the service fee (after annual deductible is met).  Patient agreed to services and verbal consent obtained.   The patient verbalized understanding of instructions, educational materials, and care plan provided today and declined offer to receive copy of patient instructions, educational materials, and care plan.   Telephone follow up appointment with care management team member scheduled for: The patient has been provided with contact information for the care management team and has been advised to call with any health related questions or concerns.  Follow up with provider re: left sided hip and leg pain as well as to follow up on continued heart palpitations   Tomasa Blase, PharmD Clinical  Pharmacist, [place]      CLINICAL CARE PLAN: Patient Care Plan: CCM Care Plan     Problem Identified: HTN, OAB, Hypothyroidism   Priority: High  Onset Date: 06/09/2021     Long-Range Goal: Disease Management   Start Date: 06/09/2021  Expected End Date: 12/09/2021  This Visit's Progress: On track  Priority: High  Note:   Current Barriers:  Unable to independently monitor therapeutic efficacy Does not adhere to prescribed medication regimen  Pharmacist Clinical Goal(s):  Patient will achieve adherence to monitoring guidelines and medication adherence to achieve therapeutic efficacy maintain control of blood pressure as evidenced by blood pressure logs  adhere to prescribed medication regimen as evidenced by 0 missed doses of prescribed medications  contact provider office for questions/concerns as evidenced notation of same in electronic health record through collaboration with PharmD and provider.   Interventions: 1:1 collaboration with Hoyt Koch, MD regarding development and update of comprehensive plan of care as evidenced by provider  attestation and co-signature Inter-disciplinary care team collaboration (see longitudinal plan of care) Comprehensive medication review performed; medication list updated in electronic medical record  Hypertension (BP goal <140/90) -Controlled -Current treatment: Amlodipine 44m daily  Lisinopril 464mdaily  Metoprolol succinate 10040mwice daily  -Medications previously tried: furosemide   - Reports to heart palpitations (racing heart beat) about 3 times a week, chest heaviness at times, denies chest pains - reports that this has been ongoing for >6 months  -Current home readings: n/a - does not have blood pressure cuff at this time  -Current dietary habits: sodas - orange crush or sprite - 2-4 12oz cans throughout the day  -Current exercise habits: limited - recent left leg pain - uses walker to ambulate  -Denies hypotensive/hypertensive symptoms -Educated on BP goals and benefits of medications for prevention of heart attack, stroke and kidney damage; Daily salt intake goal < 2300 mg; Importance of home blood pressure monitoring; Proper BP monitoring technique; Symptoms of hypotension and importance of maintaining adequate hydration; -Counseled to monitor BP at home at least once weekly, document, and provide log at future appointments -Recommended to continue current medication Recommended for patient to schedule follow up appointment to discuss palpitations (reports that these have been ongoing for >1 year - no recent worsening or issues with chest pains/ trouble breathing   Hypothyroidism (Goal: Maintenance of euthyroid levels) -Controlled - Last TSH level - 2.00 uIU/mL (01/30/2021) - Last Free T4 level - 1.04 ng/dL (01/30/2021) -Current treatment  Levothyroxine 63m57maily  -Medications previously tried: n/a  -Recommended to continue current medication  Urge Incontinence (Goal: Maintain / gain control of urinary symptoms) -Not ideally controlled -Current treatment  Oxybutynin  XL 5mg 59mly  -Medications previously tried: myrbetriq (did not start due to cost)  - Current Fluid Intake Habits: reports to drinking 2-4 sodas daily  -Counseled on effects of caffeine that could be the reason for increased urge / nocturia - advised for patient to avoid caffeinated beverages/ reduce intake  Recommended for patient to start taking oxybutynin for her overactive bladder   Health Maintenance -Vaccine gaps: Shingles / COVID booster  -Current therapy:  Miralax - 17g daily as needed  Nystatin cream - applied twice daily to stomach  Naproxen 220mg 78mtablet twice daily if needed  Noxzema (salicylic acid 2%)- applied to face daily  -Patient is satisfied with current therapy and denies issues - Patient notes that at times feels that face can feel dry and itchy - notes  she does not rinse face after using noxzema - advised for patient to rinse face with water following application / use daily moisturizing cream on her face  -Patient notes to athletes foot at times that causes itching - advised for use of OTC lotromin / keeping feet dry - use of gold bond foot powder  -Recommended to continue current medication  Patient Goals/Self-Care Activities Patient will:  - take medications as prescribed focus on medication adherence by taking all prescribed medications as directed check blood pressure once weekly, document, and provide at future appointments engage in dietary modifications by reducing caffeine intake throughout the day  Follow Up Plan: Telephone follow up appointment with care management team member scheduled for: The patient has been provided with contact information for the care management team and has been advised to call with any health related questions or concerns.  Follow up with provider re: left leg pain, continued ongoing heart palpitations

## 2021-06-16 ENCOUNTER — Other Ambulatory Visit: Payer: Self-pay | Admitting: Internal Medicine

## 2021-06-16 DIAGNOSIS — E2839 Other primary ovarian failure: Secondary | ICD-10-CM

## 2021-06-16 NOTE — Progress Notes (Signed)
Patient has been informed about order being placed, will call to schedule

## 2021-07-15 DIAGNOSIS — Z23 Encounter for immunization: Secondary | ICD-10-CM | POA: Diagnosis not present

## 2021-07-23 ENCOUNTER — Telehealth: Payer: Self-pay

## 2021-07-23 NOTE — Chronic Care Management (AMB) (Signed)
Spoke with patient,   Reports that she had her first COVID booster at [facility] 07/15/2021  Reports that after injection she has been having mild irritation/ itching at injection site, notes to small amount of redness/ irritation, denies fever / night sweats, no issues with nausea or vomitting  Advised for patient to monitor injection site and for signs/ symptoms of infection.  Should she develop signs/symptoms of infection or swelling increases she will go to urgent care for further evaluation.   Patient to clean area with soap and water, likely pain/ irritation is possible side effect of vaccination, patient will call back with any other issues or concerns   Ellin Saba, PharmD Clinical Pharmacist, [facility]  07/23/21 10:13 AM

## 2021-08-10 ENCOUNTER — Telehealth: Payer: Medicare Other

## 2021-09-10 DIAGNOSIS — H5203 Hypermetropia, bilateral: Secondary | ICD-10-CM | POA: Diagnosis not present

## 2021-09-10 DIAGNOSIS — H2513 Age-related nuclear cataract, bilateral: Secondary | ICD-10-CM | POA: Diagnosis not present

## 2021-09-10 DIAGNOSIS — H524 Presbyopia: Secondary | ICD-10-CM | POA: Diagnosis not present

## 2021-09-10 DIAGNOSIS — H04123 Dry eye syndrome of bilateral lacrimal glands: Secondary | ICD-10-CM | POA: Diagnosis not present

## 2022-01-26 ENCOUNTER — Telehealth: Payer: Self-pay | Admitting: Internal Medicine

## 2022-01-26 NOTE — Telephone Encounter (Signed)
Left message for patient to call back to schedule Medicare Annual Wellness Visit  ° °Last AWV  01/30/21 ° °Please schedule at anytime with [facility]-Nurse Health Advisor if patient calls the office back.   ° °40 Minutes appointment  ° °Any questions, please call me at [phone]  °

## 2022-02-03 ENCOUNTER — Other Ambulatory Visit: Payer: Self-pay | Admitting: Internal Medicine

## 2022-02-03 DIAGNOSIS — I10 Essential (primary) hypertension: Secondary | ICD-10-CM

## 2022-02-08 ENCOUNTER — Other Ambulatory Visit: Payer: Self-pay | Admitting: Internal Medicine

## 2022-02-08 DIAGNOSIS — I10 Essential (primary) hypertension: Secondary | ICD-10-CM

## 2022-02-09 ENCOUNTER — Other Ambulatory Visit: Payer: Self-pay | Admitting: Internal Medicine

## 2022-02-09 DIAGNOSIS — I10 Essential (primary) hypertension: Secondary | ICD-10-CM

## 2022-02-09 DIAGNOSIS — E039 Hypothyroidism, unspecified: Secondary | ICD-10-CM

## 2022-02-10 ENCOUNTER — Telehealth: Payer: Self-pay

## 2022-02-10 NOTE — Telephone Encounter (Signed)
Pt is very upset yelling stating that the pharmacy has reached out 3 times and they haven't got a response about the BP medication.  Pt is requesting that lisinopril (ZESTRIL) 40 MG tablet be refilled. I advised the pt that it was rejected due to her not being seen since 01/30/21. Pt states that the pharmacy said they didn't get any response.I offered to scheduled pt a follow up appt and to send another request and let them know that appt was made. She interjects and saying Im not scheduling no appt until I get my medicine.    Pt then asked to be transferred to [facility] the pharmacist. I advised her that I could take a message for him to return her call. Her response yeah do that.     Pt then states she wanted to talk to Dr. Okey Dupre and I stated that I can send a message to her nurse to have her call back. Pt states no I want to talk to Dr. Okey Dupre. I then offered again to set up an appt she declines again.   I advised pt that a 30 days of lisinopril (ZESTRIL) 40 MG tablet will be called in with 0 refills and  appt will need to be made before any more refills will be issued.  FYI

## 2022-02-11 ENCOUNTER — Other Ambulatory Visit: Payer: Self-pay | Admitting: Internal Medicine

## 2022-02-11 DIAGNOSIS — I10 Essential (primary) hypertension: Secondary | ICD-10-CM

## 2022-03-03 ENCOUNTER — Telehealth: Payer: Self-pay | Admitting: Internal Medicine

## 2022-03-03 DIAGNOSIS — I10 Essential (primary) hypertension: Secondary | ICD-10-CM

## 2022-03-03 NOTE — Telephone Encounter (Signed)
Pt requesting a short supply of amLODipine (NORVASC) 5 MG tablet until her next appt on 03-09-2022 ? ?Pts lov 01-30-2021 ?

## 2022-03-04 MED ORDER — AMLODIPINE BESYLATE 5 MG PO TABS: 5.0000 mg | ORAL_TABLET | Freq: Every day | ORAL | 0 refills | Status: DC

## 2022-03-04 NOTE — Telephone Encounter (Signed)
Short supply has been sent in.  ?

## 2022-03-06 ENCOUNTER — Other Ambulatory Visit: Payer: Self-pay | Admitting: Internal Medicine

## 2022-03-06 DIAGNOSIS — I10 Essential (primary) hypertension: Secondary | ICD-10-CM

## 2022-03-09 ENCOUNTER — Ambulatory Visit (INDEPENDENT_AMBULATORY_CARE_PROVIDER_SITE_OTHER): Payer: Medicare Other | Admitting: Internal Medicine

## 2022-03-09 ENCOUNTER — Encounter: Payer: Self-pay | Admitting: Internal Medicine

## 2022-03-09 ENCOUNTER — Other Ambulatory Visit: Payer: Self-pay

## 2022-03-09 VITALS — BP 124/80 | HR 53 | Resp 18 | Ht 63.0 in | Wt 206.6 lb

## 2022-03-09 DIAGNOSIS — E039 Hypothyroidism, unspecified: Secondary | ICD-10-CM | POA: Diagnosis not present

## 2022-03-09 DIAGNOSIS — G4733 Obstructive sleep apnea (adult) (pediatric): Secondary | ICD-10-CM | POA: Diagnosis not present

## 2022-03-09 DIAGNOSIS — Z0000 Encounter for general adult medical examination without abnormal findings: Secondary | ICD-10-CM | POA: Diagnosis not present

## 2022-03-09 DIAGNOSIS — K089 Disorder of teeth and supporting structures, unspecified: Secondary | ICD-10-CM | POA: Diagnosis not present

## 2022-03-09 DIAGNOSIS — N3941 Urge incontinence: Secondary | ICD-10-CM

## 2022-03-09 DIAGNOSIS — F4323 Adjustment disorder with mixed anxiety and depressed mood: Secondary | ICD-10-CM

## 2022-03-09 DIAGNOSIS — I10 Essential (primary) hypertension: Secondary | ICD-10-CM

## 2022-03-09 LAB — LIPID PANEL
Cholesterol: 158 mg/dL (ref 0–200)
HDL: 43.3 mg/dL (ref >39.00)
LDL Cholesterol: 95 mg/dL (ref 0–99)
NonHDL: 114.76
Total CHOL/HDL Ratio: 4
Triglycerides: 97 mg/dL (ref 0.0–149.0)
VLDL: 19.4 mg/dL (ref 0.0–40.0)

## 2022-03-09 LAB — CBC
HCT: 41.5 % (ref 36.0–46.0)
Hemoglobin: 13.5 g/dL (ref 12.0–15.0)
MCHC: 32.6 g/dL (ref 30.0–36.0)
MCV: 87.3 fl (ref 78.0–100.0)
Platelets: 301 10*3/uL (ref 150.0–400.0)
RBC: 4.76 Mil/uL (ref 3.87–5.11)
RDW: 15 % (ref 11.5–15.5)
WBC: 7 10*3/uL (ref 4.0–10.5)

## 2022-03-09 LAB — COMPREHENSIVE METABOLIC PANEL
ALT: 13 U/L (ref 0–35)
AST: 18 U/L (ref 0–37)
Albumin: 4 g/dL (ref 3.5–5.2)
Alkaline Phosphatase: 63 U/L (ref 39–117)
BUN: 15 mg/dL (ref 6–23)
CO2: 30 mEq/L (ref 19–32)
Calcium: 9.6 mg/dL (ref 8.4–10.5)
Chloride: 106 mEq/L (ref 96–112)
Creatinine, Ser: 0.96 mg/dL (ref 0.40–1.20)
GFR: 58.66 mL/min — ABNORMAL LOW (ref >60.00)
Glucose, Bld: 81 mg/dL (ref 70–99)
Potassium: 4 mEq/L (ref 3.5–5.1)
Sodium: 144 mEq/L (ref 135–145)
Total Bilirubin: 0.5 mg/dL (ref 0.2–1.2)
Total Protein: 7 g/dL (ref 6.0–8.3)

## 2022-03-09 LAB — TSH: TSH: 2.33 u[IU]/mL (ref 0.35–5.50)

## 2022-03-09 LAB — T4, FREE: Free T4: 1.25 ng/dL (ref 0.60–1.60)

## 2022-03-09 MED ORDER — AMLODIPINE BESYLATE 5 MG PO TABS: 5.0000 mg | ORAL_TABLET | Freq: Every day | ORAL | 3 refills | Status: DC

## 2022-03-09 MED ORDER — METOPROLOL SUCCINATE ER 100 MG PO TB24: 100.0000 mg | ORAL_TABLET | Freq: Two times a day (BID) | ORAL | 3 refills | Status: DC

## 2022-03-09 MED ORDER — LISINOPRIL 40 MG PO TABS: 40.0000 mg | ORAL_TABLET | Freq: Every day | ORAL | 3 refills | Status: DC

## 2022-03-09 MED ORDER — LEVOTHYROXINE SODIUM 50 MCG PO TABS: 50.0000 ug | ORAL_TABLET | Freq: Every day | ORAL | 3 refills | Status: DC

## 2022-03-09 NOTE — Assessment & Plan Note (Signed)
Flu shot declines. Covid-19 booster encouraged. Pneumonia complete. Shingrix counseled to get at pharmacy. Tetanus due 2029. Colonoscopy due 2030 if appropriate depending on health. Mammogram declines, pap smear aged out and dexa declines previously ordered. Counseled about sun safety and mole surveillance. Counseled about the dangers of distracted driving. Given 10 year screening recommendations.  ? ?

## 2022-03-09 NOTE — Progress Notes (Signed)
? ?Subjective:  ? ?Patient ID: Frances [name], female    DOB: 08-May-1946, 76 y.o.   MRN: [id number] ? ?HPI ?Here for medicare wellness, no new complaints. Please see A/P for status and treatment of chronic medical problems.  ? ?Diet: heart healthy ?Physical activity: sedentary ?Depression/mood screen: negative ?Hearing: intact to whispered voice ?Visual acuity: grossly normal with lens, performs annual eye exam  ?ADLs: capable ?Fall risk: none ?Home safety: good ?Cognitive evaluation: intact to orientation, naming, recall and repetition ?EOL planning: adv directives discussed, given packet ? ?[place] Office Visit from 03/09/2022 in [place] at [place]  ?PHQ-2 Total Score 1  ? ?  ?  ?[place] Office Visit from 01/30/2021 in [place] at [place]  ?PHQ-9 Total Score 20  ? ?  ? ?Fall Risk 09/25/2018 10/31/2019 10/31/2019 01/30/2021 03/09/2022  ?Falls in the past year? Yes 0 - 0 1  ?Was there an injury with Fall? No 0 - 0 0  ?Fall Risk Category Calculator - 0 - 0 2  ?Fall Risk Category - Low - Low Moderate  ?Patient Fall Risk Level - Low fall risk Low fall risk - -  ?Fall risk Follow up - Falls prevention discussed - - -  ? ? ?I have personally reviewed and have noted ?1. The patient's medical and social history - reviewed today no changes ?2. Their use of alcohol, tobacco or illicit drugs ?3. Their current medications and supplements ?4. The patient's functional ability including ADL's, fall risks, home safety risks and hearing or visual impairment. ?5. Diet and physical activities ?6. Evidence for depression or mood disorders ?7. Care team reviewed and updated ?8.  The patient is not on an opioid pain medication. ? ?Patient Care Team: ?Hoyt Koch, MD as PCP - General (Internal Medicine) ?Irene Shipper, MD as Consulting Physician (Gastroenterology) ?Tomasa Blase, [facility] as Pharmacist (Pharmacist) ?Past Medical History:  ?Diagnosis Date  ? Anxiety   ? Arthritis   ? Blood in  stool   ? Depression   ? GERD (gastroesophageal reflux disease)   ? Hypertension   ? Sleep apnea   ? Thyroid disease   ? UTI (urinary tract infection)   ? ?Past Surgical History:  ?Procedure Laterality Date  ? HERNIA REPAIR    ? umbilical repair  ? ?Family History  ?Problem Relation Age of Onset  ? Arthritis Mother   ? Alcohol abuse Father   ? Arthritis Father   ? Stroke Father   ? Arthritis Maternal Grandmother   ? Stroke Maternal Grandmother   ? Arthritis Maternal Grandfather   ? Arthritis Paternal Grandmother   ? Arthritis Paternal Grandfather   ? Colon cancer Neg Hx   ? Esophageal cancer Neg Hx   ? Rectal cancer Neg Hx   ? Stomach cancer Neg Hx   ? ?Review of Systems  ?Constitutional: Negative.   ?HENT: Negative.    ?Eyes: Negative.   ?Respiratory:  Negative for cough, chest tightness and shortness of breath.   ?Cardiovascular:  Negative for chest pain, palpitations and leg swelling.  ?Gastrointestinal:  Negative for abdominal distention, abdominal pain, constipation, diarrhea, nausea and vomiting.  ?Musculoskeletal: Negative.   ?Skin: Negative.   ?Neurological: Negative.   ?Psychiatric/Behavioral:  Positive for dysphoric mood.   ? ?Objective:  ?Physical Exam ?Constitutional:   ?   Appearance: She is well-developed. She is obese.  ?HENT:  ?   Head: Normocephalic and atraumatic.  ?Cardiovascular:  ?   Rate and Rhythm: Normal  rate and regular rhythm.  ?Pulmonary:  ?   Effort: Pulmonary effort is normal. No respiratory distress.  ?   Breath sounds: Normal breath sounds. No wheezing or rales.  ?Abdominal:  ?   General: Bowel sounds are normal. There is no distension.  ?   Palpations: Abdomen is soft.  ?   Tenderness: There is no abdominal tenderness. There is no rebound.  ?Musculoskeletal:  ?   Cervical back: Normal range of motion.  ?Skin: ?   General: Skin is warm and dry.  ?Neurological:  ?   Mental Status: She is alert and oriented to person, place, and time.  ?   Coordination: Coordination normal.  ? ? ?Vitals:   ? 03/09/22 0936  ?BP: 124/80  ?Pulse: (!) 53  ?Resp: 18  ?SpO2: 97%  ?Weight: 206 lb 9.6 oz (93.7 kg)  ?Height: 5\' 3"  (1.6 m)  ? ?This visit occurred during the SARS-CoV-2 public health emergency.  Safety protocols were in place, including screening questions prior to the visit, additional usage of staff PPE, and extensive cleaning of exam room while observing appropriate contact time as indicated for disinfecting solutions.  ? ?Assessment & Plan:  ? ?

## 2022-03-09 NOTE — Assessment & Plan Note (Signed)
Checking TSH and free T4 and adjust levothyroxine 50 mcg daily. She is instructed to continue daily and take separate from other meds/foods.  ?

## 2022-03-09 NOTE — Assessment & Plan Note (Signed)
Still struggling slightly with this. Did not try lexapro previously and does not wish medications. She is overall improved from last year.  ?

## 2022-03-09 NOTE — Assessment & Plan Note (Signed)
BP at goal today. Will continue amlodipine 5 mg daily, metoprolol 100 mg BID and lisinopril 40 mg daily. Checking CMP and adjust as needed.  ?

## 2022-03-09 NOTE — Assessment & Plan Note (Signed)
Is struggling with this and has some work which needs to be done.  ?

## 2022-03-09 NOTE — Assessment & Plan Note (Signed)
Did not try oxybutynin to her knowledge. Does not want medication currently.  ?

## 2022-03-09 NOTE — Assessment & Plan Note (Signed)
Not using for some time CPAP and due to life events does not want to pursue today. ?

## 2022-03-09 NOTE — Patient Instructions (Signed)
Ask about shingles vaccine as it may be less expensive than in the past. ? ? ?

## 2022-05-04 ENCOUNTER — Telehealth: Payer: Self-pay

## 2022-05-04 NOTE — Telephone Encounter (Signed)
Pt is calling requesting a renew Hand[employer] parking form for her gout. Pt is requesting to pick up completed form tomorrow as she is going to [facility] to take care of other matters. ? ?Please call pt when ready [phone].  ? ?I advised pt 7-10 BUS turnaround ? ?Please advise ?

## 2022-05-06 NOTE — Telephone Encounter (Signed)
I don't have a form in my box?

## 2022-05-19 ENCOUNTER — Ambulatory Visit: Payer: Medicare Other | Admitting: Internal Medicine

## 2022-06-02 ENCOUNTER — Ambulatory Visit: Payer: Medicare Other | Admitting: Internal Medicine

## 2022-06-08 ENCOUNTER — Ambulatory Visit (INDEPENDENT_AMBULATORY_CARE_PROVIDER_SITE_OTHER): Payer: Medicare Other | Admitting: Internal Medicine

## 2022-06-08 ENCOUNTER — Encounter: Payer: Self-pay | Admitting: Internal Medicine

## 2022-06-08 VITALS — BP 140/80 | HR 86 | Temp 98.5°F | Resp 18 | Ht 63.0 in | Wt 206.0 lb

## 2022-06-08 DIAGNOSIS — E559 Vitamin D deficiency, unspecified: Secondary | ICD-10-CM

## 2022-06-08 DIAGNOSIS — R269 Unspecified abnormalities of gait and mobility: Secondary | ICD-10-CM | POA: Diagnosis not present

## 2022-06-08 DIAGNOSIS — M159 Polyosteoarthritis, unspecified: Secondary | ICD-10-CM

## 2022-06-08 DIAGNOSIS — F4323 Adjustment disorder with mixed anxiety and depressed mood: Secondary | ICD-10-CM

## 2022-06-08 LAB — VITAMIN D 25 HYDROXY (VIT D DEFICIENCY, FRACTURES): VITD: 20.72 ng/mL — ABNORMAL LOW (ref 30.00–100.00)

## 2022-06-08 NOTE — Assessment & Plan Note (Signed)
She is worse lately due to family situation. Referral to counseling services per patient request.

## 2022-06-08 NOTE — Assessment & Plan Note (Signed)
Checking vitamin D and adjust otc dosing as needed. Some fatigue currently.

## 2022-06-08 NOTE — Assessment & Plan Note (Signed)
Referral to PT for assessment for appropriateness for scooter and can prescribe if indicated.

## 2022-06-08 NOTE — Patient Instructions (Addendum)
We will get you in for the physical therapy evaluation for the scooter.  We will have you talk to a therapist.   We will check the vitamin D today.

## 2022-06-08 NOTE — Progress Notes (Signed)
   Subjective:   Patient ID: Frances [name], female    DOB: 1946-01-13, 76 y.o.   MRN: [id number]  HPI The patient is a 76 YO female coming in for several concerns. Thinks she would benefit from scooter for mobility. Having pain in her stomach due to skin hanging and wants to know if a binder would help.  Review of Systems  Constitutional:  Positive for activity change. Negative for appetite change, chills, fatigue, fever and unexpected weight change.  Respiratory: Negative.    Cardiovascular: Negative.   Gastrointestinal:  Positive for abdominal pain.  Musculoskeletal:  Positive for arthralgias, back pain and myalgias. Negative for gait problem and joint swelling.  Skin: Negative.   Neurological: Negative.     Objective:  Physical Exam Constitutional:      Appearance: She is well-developed. She is obese.  HENT:     Head: Normocephalic and atraumatic.  Cardiovascular:     Rate and Rhythm: Normal rate and regular rhythm.  Pulmonary:     Effort: Pulmonary effort is normal. No respiratory distress.     Breath sounds: Normal breath sounds. No wheezing or rales.  Abdominal:     General: Bowel sounds are normal. There is no distension.     Palpations: Abdomen is soft.     Tenderness: There is no abdominal tenderness. There is no rebound.  Musculoskeletal:        General: Tenderness present.     Cervical back: Normal range of motion.  Skin:    General: Skin is warm and dry.  Neurological:     Mental Status: She is alert and oriented to person, place, and time.     Coordination: Coordination abnormal.     Vitals:   06/08/22 0925  BP: 140/80  Pulse: 86  Resp: 18  Temp: 98.5 F (36.9 C)  SpO2: 99%  Weight: 206 lb (93.4 kg)  Height: 5\' 3"  (1.6 m)    Assessment & Plan:

## 2022-06-10 ENCOUNTER — Other Ambulatory Visit: Payer: Self-pay | Admitting: Internal Medicine

## 2022-06-10 MED ORDER — VITAMIN D (ERGOCALCIFEROL) 1.25 MG (50000 UNIT) PO CAPS: 50000.0000 [IU] | ORAL_CAPSULE | ORAL | 0 refills | Status: DC

## 2022-06-18 ENCOUNTER — Ambulatory Visit: Payer: Medicare Other | Admitting: Internal Medicine

## 2022-07-27 ENCOUNTER — Encounter: Payer: Self-pay | Admitting: Family Medicine

## 2022-07-27 ENCOUNTER — Ambulatory Visit (INDEPENDENT_AMBULATORY_CARE_PROVIDER_SITE_OTHER): Payer: Medicare Other | Admitting: Family Medicine

## 2022-07-27 ENCOUNTER — Telehealth: Payer: Self-pay

## 2022-07-27 VITALS — BP 150/80 | HR 60 | Temp 97.7°F | Ht 63.0 in | Wt 205.0 lb

## 2022-07-27 DIAGNOSIS — R519 Headache, unspecified: Secondary | ICD-10-CM

## 2022-07-27 DIAGNOSIS — R197 Diarrhea, unspecified: Secondary | ICD-10-CM | POA: Diagnosis not present

## 2022-07-27 DIAGNOSIS — R42 Dizziness and giddiness: Secondary | ICD-10-CM | POA: Diagnosis not present

## 2022-07-27 DIAGNOSIS — R002 Palpitations: Secondary | ICD-10-CM | POA: Diagnosis not present

## 2022-07-27 LAB — CBC WITH DIFFERENTIAL/PLATELET
Basophils Absolute: 0 10*3/uL (ref 0.0–0.1)
Basophils Relative: 0.4 % (ref 0.0–3.0)
Eosinophils Absolute: 0.1 10*3/uL (ref 0.0–0.7)
Eosinophils Relative: 2.3 % (ref 0.0–5.0)
HCT: 41.5 % (ref 36.0–46.0)
Hemoglobin: 13.6 g/dL (ref 12.0–15.0)
Lymphocytes Relative: 29.6 % (ref 12.0–46.0)
Lymphs Abs: 1.8 10*3/uL (ref 0.7–4.0)
MCHC: 32.8 g/dL (ref 30.0–36.0)
MCV: 87.6 fl (ref 78.0–100.0)
Monocytes Absolute: 0.4 10*3/uL (ref 0.1–1.0)
Monocytes Relative: 6.8 % (ref 3.0–12.0)
Neutro Abs: 3.7 10*3/uL (ref 1.4–7.7)
Neutrophils Relative %: 60.9 % (ref 43.0–77.0)
Platelets: 285 10*3/uL (ref 150.0–400.0)
RBC: 4.74 Mil/uL (ref 3.87–5.11)
RDW: 15.7 % — ABNORMAL HIGH (ref 11.5–15.5)
WBC: 6.1 10*3/uL (ref 4.0–10.5)

## 2022-07-27 LAB — COMPREHENSIVE METABOLIC PANEL
ALT: 11 U/L (ref 0–35)
AST: 17 U/L (ref 0–37)
Albumin: 3.9 g/dL (ref 3.5–5.2)
Alkaline Phosphatase: 57 U/L (ref 39–117)
BUN: 14 mg/dL (ref 6–23)
CO2: 30 mEq/L (ref 19–32)
Calcium: 9.3 mg/dL (ref 8.4–10.5)
Chloride: 106 mEq/L (ref 96–112)
Creatinine, Ser: 0.97 mg/dL (ref 0.40–1.20)
GFR: 57.77 mL/min — ABNORMAL LOW (ref >60.00)
Glucose, Bld: 79 mg/dL (ref 70–99)
Potassium: 3.9 mEq/L (ref 3.5–5.1)
Sodium: 144 mEq/L (ref 135–145)
Total Bilirubin: 0.4 mg/dL (ref 0.2–1.2)
Total Protein: 7 g/dL (ref 6.0–8.3)

## 2022-07-27 LAB — TSH: TSH: 1.82 u[IU]/mL (ref 0.35–5.50)

## 2022-07-27 NOTE — Telephone Encounter (Signed)
Pt in office requesting a f/u call from Dr. Frutoso Chase nurse regarding getting her a motorized scooter. Pt states she received a call from someone in regards to having to be screened before it's approved but could not quite remember what it said and she doesn't have access to the message that was left.

## 2022-07-27 NOTE — Progress Notes (Signed)
Her labs are fine. Her kidney function is mildly decreased but stable compared to the previous few years. Normal thyroid function, and electrolytes.

## 2022-07-27 NOTE — Assessment & Plan Note (Signed)
2 day hx. Recommend hydration. Check labs. Follow up if not improving.

## 2022-07-27 NOTE — Assessment & Plan Note (Signed)
Not new but worsening per patient. PACs on EKG, no acute changes compared to EKG in 2019. Referral to cardiology per patient request.

## 2022-07-27 NOTE — Assessment & Plan Note (Signed)
MRI brain ordered. Take Tylenol for pain. Increase hydration

## 2022-07-27 NOTE — Progress Notes (Signed)
Subjective:     Patient ID: Frances [name], female    DOB: April 11, 1946, 76 y.o.   MRN: [id number]  Chief Complaint  Patient presents with   Dizziness    Last Friday ate a late breakfast around noon and when she was eating she began to feel lightheaded like she was about to pass out. States when she is at the store she has to hold onto the cart to stay stable, has some dizzy spells at times.    Pruritis    Face has been more itchy and irritated recently, thinks due to her wearing masks and would like some cream to help     HPI Patient is in today for near syncopal episode last week and persistent dizziness. Reports sharp pain in her head at times. This is new.  Denies vision changes, numbness, tingling or weakness.   Complains of palpitations, becoming more bothersome. Worse in the mornings when she gets up. Has not seen cardiology in several years per patient.   Denies fever, chills, chest pain, shortness of breath, abdominal pain, N/V/D, urinary symptoms, LE edema.      Health Maintenance Due  Topic Date Due   MAMMOGRAM  Never done   Zoster Vaccines- Shingrix (1 of 2) Never done   DEXA SCAN  Never done   COVID-19 Vaccine (3 - Pfizer series) 03/25/2020   INFLUENZA VACCINE  07/20/2022    Past Medical History:  Diagnosis Date   Anxiety    Arthritis    Blood in stool    Depression    GERD (gastroesophageal reflux disease)    Hypertension    Sleep apnea    Thyroid disease    UTI (urinary tract infection)     Past Surgical History:  Procedure Laterality Date   HERNIA REPAIR     umbilical repair    Family History  Problem Relation Age of Onset   Arthritis Mother    Alcohol abuse Father    Arthritis Father    Stroke Father    Arthritis Maternal Grandmother    Stroke Maternal Grandmother    Arthritis Maternal Grandfather    Arthritis Paternal Grandmother    Arthritis Paternal Grandfather    Colon cancer Neg Hx    Esophageal cancer Neg Hx    Rectal cancer Neg  Hx    Stomach cancer Neg Hx     Social History   Socioeconomic History   Marital status: Divorced    Spouse name: Not on file   Number of children: 1   Years of education: Not on file   Highest education level: Not on file  Occupational History   Occupation: retired  Tobacco Use   Smoking status: Never   Smokeless tobacco: Never  Vaping Use   Vaping Use: Never used  Substance and Sexual Activity   Alcohol use: No    Alcohol/week: 0.0 standard drinks of alcohol   Drug use: No   Sexual activity: Not Currently  Other Topics Concern   Not on file  Social History Narrative   Not on file   Social Determinants of Health   Financial Resource Strain: Low Risk  (06/09/2021)   Overall Financial Resource Strain (CARDIA)    Difficulty of Paying Living Expenses: Not hard at all  Food Insecurity: No Food Insecurity (10/31/2019)   Hunger Vital Sign    Worried About Running Out of Food in the Last Year: Never true    Ran Out of Food in the Last Year: Never  true  Transportation Needs: No Transportation Needs (10/31/2019)   PRAPARE - Administrator, Civil Service (Medical): No    Lack of Transportation (Non-Medical): No  Physical Activity: Inactive (10/31/2019)   Exercise Vital Sign    Days of Exercise per Week: 0 days    Minutes of Exercise per Session: 0 min  Stress: No Stress Concern Present (10/31/2019)   [employer] of Occupational Health - Occupational Stress Questionnaire    Feeling of Stress : Not at all  Social Connections: Moderately Integrated (10/31/2019)   Social Connection and Isolation Panel [NHANES]    Frequency of Communication with Friends and Family: More than three times a week    Frequency of Social Gatherings with Friends and Family: More than three times a week    Attends Religious Services: 1 to 4 times per year    Active Member of [employer] or Organizations: Not on file    Attends Banker Meetings: 1 to 4 times per year    Marital  Status: Divorced  Catering manager Violence: Not on file    Outpatient Medications Prior to Visit  Medication Sig Dispense Refill   amLODipine (NORVASC) 5 MG tablet Take 1 tablet (5 mg total) by mouth daily. 90 tablet 3   levothyroxine (SYNTHROID) 50 MCG tablet Take 1 tablet (50 mcg total) by mouth daily before breakfast. 90 tablet 3   lisinopril (ZESTRIL) 40 MG tablet Take 1 tablet (40 mg total) by mouth daily. 90 tablet 3   metoprolol succinate (TOPROL-XL) 100 MG 24 hr tablet Take 1 tablet (100 mg total) by mouth 2 (two) times daily. 180 tablet 3   naproxen sodium (ALEVE) 220 MG tablet Take 220 mg by mouth 2 (two) times daily as needed.     polyethylene glycol (MIRALAX / GLYCOLAX) packet Take 17 g by mouth daily as needed.     Salicylic Acid 2 % PADS Apply topically daily.     Vitamin D, Ergocalciferol, (DRISDOL) 1.25 MG (50000 UNIT) CAPS capsule Take 1 capsule (50,000 Units total) by mouth every 7 (seven) days. 12 capsule 0   No facility-administered medications prior to visit.    No Known Allergies  ROS     Objective:    Physical Exam Constitutional:      General: She is not in acute distress.    Appearance: She is not ill-appearing.  HENT:     Right Ear: Tympanic membrane and ear canal normal.     Left Ear: Tympanic membrane and ear canal normal.     Mouth/Throat:     Mouth: Mucous membranes are moist.  Eyes:     General: Lids are normal.     Extraocular Movements: Extraocular movements intact.     Conjunctiva/sclera: Conjunctivae normal.     Pupils: Pupils are equal, round, and reactive to light.  Cardiovascular:     Rate and Rhythm: Bradycardia present. Rhythm regularly irregular.     Heart sounds: No murmur heard. Pulmonary:     Effort: Pulmonary effort is normal.     Breath sounds: Normal breath sounds.  Abdominal:     General: Bowel sounds are normal. There is no distension.     Palpations: Abdomen is soft.     Tenderness: There is no abdominal tenderness.  There is no right CVA tenderness, left CVA tenderness, guarding or rebound.  Musculoskeletal:     Cervical back: Full passive range of motion without pain and normal range of motion.     Right  lower leg: No edema.     Left lower leg: No edema.  Lymphadenopathy:     Cervical: No cervical adenopathy.  Skin:    General: Skin is warm and dry.     Capillary Refill: Capillary refill takes less than 2 seconds.  Neurological:     General: No focal deficit present.     Mental Status: She is alert and oriented to person, place, and time.     Cranial Nerves: No cranial nerve deficit.     Sensory: No sensory deficit.     Motor: No weakness.     Coordination: Coordination normal.  Psychiatric:        Mood and Affect: Mood normal.        Behavior: Behavior normal.        Thought Content: Thought content normal.     BP (!) 150/80 (BP Location: Left Arm, Patient Position: Sitting, Cuff Size: Large)   Pulse 60   Temp 97.7 F (36.5 C) (Temporal)   Ht 5\' 3"  (1.6 m)   Wt 205 lb (93 kg)   SpO2 97%   BMI 36.31 kg/m  Wt Readings from Last 3 Encounters:  07/27/22 205 lb (93 kg)  06/08/22 206 lb (93.4 kg)  03/09/22 206 lb 9.6 oz (93.7 kg)       Assessment & Plan:   Problem List Items Addressed This Visit       Other   Diarrhea    2 day hx. Recommend hydration. Check labs. Follow up if not improving.       Relevant Orders   CBC with Differential/Platelet (Completed)   Comprehensive metabolic panel (Completed)   TSH (Completed)   Dizziness    Chronic with near syncopal episode last week. MRI brain ordered. Labs ordered.       Relevant Orders   MR Brain W Wo Contrast   Ambulatory referral to Cardiology   CBC with Differential/Platelet (Completed)   Comprehensive metabolic panel (Completed)   TSH (Completed)   New onset of headaches after age 35 - Primary    MRI brain ordered. Take Tylenol for pain. Increase hydration       Relevant Orders   MR Brain W Wo Contrast    Palpitations    Not new but worsening per patient. PACs on EKG, no acute changes compared to EKG in 2019. Referral to cardiology per patient request.       Relevant Orders   EKG 12-Lead   Ambulatory referral to Cardiology   CBC with Differential/Platelet (Completed)   Comprehensive metabolic panel (Completed)   TSH (Completed)   EKG shows sinus bradycardia with PACs. No Q waves or ST changes. Compared to EKG in 2019.  I am having 2020 maintain her polyethylene glycol, naproxen sodium, Salicylic Acid, amLODipine, levothyroxine, lisinopril, metoprolol succinate, and Vitamin D (Ergocalciferol).  No orders of the defined types were placed in this encounter.

## 2022-07-27 NOTE — Assessment & Plan Note (Signed)
Chronic with near syncopal episode last week. MRI brain ordered. Labs ordered.

## 2022-07-27 NOTE — Patient Instructions (Addendum)
Go downstairs for labs before you leave.   You can try Aquaphor or a moisturizing lotion for your face under the CPAP straps.   You should receive a call from [facility] to schedule the MRI of your brain.   I am referring you to a cardiologist for further evaluations of your heart palpitations and dizziness. They will call you.   Change positions slowly. Stay well hydrated.

## 2022-07-28 ENCOUNTER — Encounter: Payer: Self-pay | Admitting: Internal Medicine

## 2022-07-28 ENCOUNTER — Ambulatory Visit (INDEPENDENT_AMBULATORY_CARE_PROVIDER_SITE_OTHER): Payer: Medicare Other | Admitting: Internal Medicine

## 2022-07-28 ENCOUNTER — Telehealth: Payer: Self-pay

## 2022-07-28 ENCOUNTER — Ambulatory Visit (INDEPENDENT_AMBULATORY_CARE_PROVIDER_SITE_OTHER): Payer: Medicare Other

## 2022-07-28 ENCOUNTER — Telehealth: Payer: Self-pay | Admitting: Internal Medicine

## 2022-07-28 VITALS — BP 134/62 | HR 69 | Ht 63.0 in | Wt 208.6 lb

## 2022-07-28 DIAGNOSIS — R9431 Abnormal electrocardiogram [ECG] [EKG]: Secondary | ICD-10-CM | POA: Diagnosis not present

## 2022-07-28 DIAGNOSIS — R002 Palpitations: Secondary | ICD-10-CM | POA: Diagnosis not present

## 2022-07-28 NOTE — Telephone Encounter (Signed)
Please call patient back - she returned the call.  Please call back at [phone]

## 2022-07-28 NOTE — Patient Instructions (Signed)
Medication Instructions:  No Changes In Medications at this time.  *If you need a refill on your cardiac medications before your next appointment, please call your pharmacy*  Lab Work: None Ordered At This Time.  If you have labs (blood work) drawn today and your tests are completely normal, you will receive your results only by: MyChart Message (if you have MyChart) OR A paper copy in the mail If you have any lab test that is abnormal or we need to change your treatment, we will call you to review the results.  Testing/Procedures: Your physician has requested that you have an echocardiogram. Echocardiography is a painless test that uses sound waves to create images of your heart. It provides your doctor with information about the size and shape of your heart and how well your heart's chambers and valves are working. You may receive an ultrasound enhancing agent through an IV if needed to better visualize your heart during the echo.This procedure takes approximately one hour. There are no restrictions for this procedure. This will take place at the [address], [address].    ZIO XT- Long Term Monitor Instructions   Your physician has requested you wear your ZIO patch monitor__14_____days.   This is a single patch monitor.  [employer] supplies one patch monitor per enrollment.  Additional stickers are not available.   Please do not apply patch if you will be having a Nuclear Stress Test, Echocardiogram, Cardiac CT, MRI, or Chest Xray during the time frame you would be wearing the monitor. The patch cannot be worn during these tests.  You cannot remove and re-apply the ZIO XT patch monitor.   Your ZIO patch monitor will be sent USPS Priority mail from [facility] directly to your home address. The monitor may also be mailed to a PO BOX if home delivery is not available.   It may take 3-5 days to receive your monitor after you have been enrolled.   Once you have received you  monitor, please review enclosed instructions.  Your monitor has already been registered assigning a specific monitor serial # to you.   Applying the monitor   Shave hair from upper left chest.   Hold abrader disc by orange tab.  Rub abrader in 40 strokes over left upper chest as indicated in your monitor instructions.   Clean area with 4 enclosed alcohol pads .  Use all pads to assure are is cleaned thoroughly.  Let dry.   Apply patch as indicated in monitor instructions.  Patch will be place under collarbone on left side of chest with arrow pointing upward.   Rub patch adhesive wings for 2 minutes.Remove white label marked "1".  Remove white label marked "2".  Rub patch adhesive wings for 2 additional minutes.   While looking in a mirror, press and release button in center of patch.  A small green light will flash 3-4 times .  This will be your only indicator the monitor has been turned on.     Do not shower for the first 24 hours.  You may shower after the first 24 hours.   Press button if you feel a symptom. You will hear a small click.  Record Date, Time and Symptom in the Patient Log Book.   When you are ready to remove patch, follow instructions on last 2 pages of Patient Log Book.  Stick patch monitor onto last page of Patient Log Book.   Place Patient Log Book in [facility]  box.  Use locking tab on box and tape box closed securely.  The [employer] and [employer] has [employer] on it.  Please place in mailbox as soon as possible.  Your physician should have your test results approximately 7 days after the monitor has been mailed back to [facility].   Call [facility] Customer Care at [phone] if you have questions regarding your ZIO XT patch monitor.  Call them immediately if you see an orange light blinking on your monitor.   If your monitor falls off in less than 4 days contact our Monitor department at [phone].  If your monitor becomes loose or falls off after 4 days  call [employer] at [phone] for suggestions on securing your monitor.    Follow-Up: At [facility], you and your health needs are our priority.  As part of our continuing mission to provide you with exceptional heart care, we have created designated Provider Care Teams.  These Care Teams include your primary Cardiologist (physician) and Advanced Practice Providers (APPs -  Physician Assistants and Nurse Practitioners) who all work together to provide you with the care you need, when you need it.  We recommend signing up for the patient portal called "MyChart".  Sign up information is provided on this After Visit Summary.  MyChart is used to connect with patients for Virtual Visits (Telemedicine).  Patients are able to view lab/test results, encounter notes, upcoming appointments, etc.  Non-urgent messages can be sent to your provider as well.   To learn more about what you can do with MyChart, go to [email or web address].    Your next appointment:   PENDING TEST RESULTS   The format for your next appointment:   In Person  Provider:   Maisie Fus, MD

## 2022-07-28 NOTE — Addendum Note (Signed)
Addended by: Bea Laura B on: 07/28/2022 08:53 AM   Modules accepted: Orders

## 2022-07-28 NOTE — Telephone Encounter (Signed)
Pt returned call to get lab results. She is requesting a callback.  Please advise

## 2022-07-28 NOTE — Progress Notes (Unsigned)
Enrolled for [employer] to mail a ZIO XT long term holter monitor to the patients address on file.   Serial # [id number] mailed to patient and applied in office on 09/15/22.

## 2022-07-28 NOTE — Progress Notes (Signed)
Cardiology Office Note:    Date:  07/28/2022   ID:  Frances [name], DOB [birth year], MRN [id number]  PCP:  Frances Broker, MD   [facility] Providers Cardiologist:  Maisie Fus, MD     Referring MD: Frances [name], *   No chief complaint on file. Dizziness/Pre-syncopal  History of Present Illness:    Frances [name] is a 76 y.o. female with a hx of anxiety, GERD, noted thyroid dx with normal TSH, had a near syncopal episode and cardiology referral was sent by her PCP.  EKG in clinic noted to be sinus bradycardia with PACs.  She notes that last Friday that she had a late breakfast. She felt fluttering while eating. She then felt presyncopal. She was left jittery. She noted a hx of bigeminy in 2006. She was started taking metoprolol XL 100 mg daily.  No syncopal events. She notes dizzy spells for years She has palpitations at night. She was scheduled for a brain MRI. She's been under a lot of stress with a son with autism.  She is retired. She lives with her daughter. She denies CP. No progressive dyspnea  Family Hx: GM had a stroke. Father passed from a stroke. Mother had HTN.   Past Medical History:  Diagnosis Date   Anxiety    Arthritis    Blood in stool    Depression    GERD (gastroesophageal reflux disease)    Hypertension    Sleep apnea    Thyroid disease    UTI (urinary tract infection)     Past Surgical History:  Procedure Laterality Date   HERNIA REPAIR     umbilical repair    Current Medications: Current Meds  Medication Sig   amLODipine (NORVASC) 5 MG tablet Take 1 tablet (5 mg total) by mouth daily.   levothyroxine (SYNTHROID) 50 MCG tablet Take 1 tablet (50 mcg total) by mouth daily before breakfast.   lisinopril (ZESTRIL) 40 MG tablet Take 1 tablet (40 mg total) by mouth daily.   metoprolol succinate (TOPROL-XL) 100 MG 24 hr tablet Take 1 tablet (100 mg total) by mouth 2 (two) times daily.   naproxen sodium (ALEVE) 220 MG tablet  Take 220 mg by mouth 2 (two) times daily as needed.   polyethylene glycol (MIRALAX / GLYCOLAX) packet Take 17 g by mouth daily as needed.   Salicylic Acid 2 % PADS Apply topically daily.   Vitamin D, Ergocalciferol, (DRISDOL) 1.25 MG (50000 UNIT) CAPS capsule Take 1 capsule (50,000 Units total) by mouth every 7 (seven) days.     Allergies:   Patient has no known allergies.   Social History   Socioeconomic History   Marital status: Divorced    Spouse name: Not on file   Number of children: 1   Years of education: Not on file   Highest education level: Not on file  Occupational History   Occupation: retired  Tobacco Use   Smoking status: Never   Smokeless tobacco: Never  Vaping Use   Vaping Use: Never used  Substance and Sexual Activity   Alcohol use: No    Alcohol/week: 0.0 standard drinks of alcohol   Drug use: No   Sexual activity: Not Currently  Other Topics Concern   Not on file  Social History Narrative   Not on file   Social Determinants of Health   Financial Resource Strain: Low Risk  (06/09/2021)   Overall Financial Resource Strain (CARDIA)    Difficulty of Paying Living  Expenses: Not hard at all  Food Insecurity: No Food Insecurity (10/31/2019)   Hunger Vital Sign    Worried About Running Out of Food in the Last Year: Never true    Ran Out of Food in the Last Year: Never true  Transportation Needs: No Transportation Needs (10/31/2019)   PRAPARE - Administrator, Civil Service (Medical): No    Lack of Transportation (Non-Medical): No  Physical Activity: Inactive (10/31/2019)   Exercise Vital Sign    Days of Exercise per Week: 0 days    Minutes of Exercise per Session: 0 min  Stress: No Stress Concern Present (10/31/2019)   [employer] of Occupational Health - Occupational Stress Questionnaire    Feeling of Stress : Not at all  Social Connections: Moderately Integrated (10/31/2019)   Social Connection and Isolation Panel [NHANES]     Frequency of Communication with Friends and Family: More than three times a week    Frequency of Social Gatherings with Friends and Family: More than three times a week    Attends Religious Services: 1 to 4 times per year    Active Member of [employer] or Organizations: Not on file    Attends Banker Meetings: 1 to 4 times per year    Marital Status: Divorced     Family History: The patient's family history includes Alcohol abuse in her father; Arthritis in her father, maternal grandfather, maternal grandmother, mother, paternal grandfather, and paternal grandmother; Stroke in her father and maternal grandmother. There is no history of Colon cancer, Esophageal cancer, Rectal cancer, or Stomach cancer.  ROS:   Please see the history of present illness.     All other systems reviewed and are negative.  EKGs/Labs/Other Studies Reviewed:    The following studies were reviewed today:   EKG:  EKG is  ordered today.  The ekg ordered today demonstrates   07/28/2022-Sinus brady with PACs  Recent Labs: 07/27/2022: ALT 11; BUN 14; Creatinine, Ser 0.97; Hemoglobin 13.6; Platelets 285.0; Potassium 3.9; Sodium 144; TSH 1.82  Recent Lipid Panel    Component Value Date/Time   CHOL 158 03/09/2022 1014   TRIG 97.0 03/09/2022 1014   HDL 43.30 03/09/2022 1014   CHOLHDL 4 03/09/2022 1014   VLDL 19.4 03/09/2022 1014   LDLCALC 95 03/09/2022 1014     Risk Assessment/Calculations:           Physical Exam:    VS:   Vitals:   07/28/22 0816  BP: 134/62  Pulse: 69  SpO2: 100%     Wt Readings from Last 3 Encounters:  07/28/22 208 lb 9.6 oz (94.6 kg)  07/27/22 205 lb (93 kg)  06/08/22 206 lb (93.4 kg)     GEN:  Well nourished, well developed in no acute distress HEENT: Normal NECK: No JVD; No carotid bruits LYMPHATICS: No lymphadenopathy CARDIAC: RRR, no murmurs, rubs, gallops RESPIRATORY:  Clear to auscultation without rales, wheezing or rhonchi  ABDOMEN: Soft, non-tender,  non-distended MUSCULOSKELETAL:  No edema; No deformity  SKIN: Warm and dry NEUROLOGIC:  Alert and oriented x 3 PSYCHIATRIC:  Normal affect   ASSESSMENT:    Dizziness: Her presyncopal event may have been related to low glucose. She notes persistent dizziness. She has palpations. Will want to ensure she does not have afib. Will also want to r/o valvular disease  PLAN:    In order of problems listed above:  2 week zio TTE Follow up pending results  Medication Adjustments/Labs and Tests Ordered: Current medicines are reviewed at length with the patient today.  Concerns regarding medicines are outlined above.  Orders Placed This Encounter  Procedures   LONG TERM MONITOR (3-14 DAYS)   EKG 12-Lead   ECHOCARDIOGRAM COMPLETE   No orders of the defined types were placed in this encounter.   Patient Instructions  Medication Instructions:  No Changes In Medications at this time.  *If you need a refill on your cardiac medications before your next appointment, please call your pharmacy*  Lab Work: None Ordered At This Time.  If you have labs (blood work) drawn today and your tests are completely normal, you will receive your results only by: MyChart Message (if you have MyChart) OR A paper copy in the mail If you have any lab test that is abnormal or we need to change your treatment, we will call you to review the results.  Testing/Procedures: Your physician has requested that you have an echocardiogram. Echocardiography is a painless test that uses sound waves to create images of your heart. It provides your doctor with information about the size and shape of your heart and how well your heart's chambers and valves are working. You may receive an ultrasound enhancing agent through an IV if needed to better visualize your heart during the echo.This procedure takes approximately one hour. There are no restrictions for this procedure. This will take place at the [address].    ZIO XT- Long Term Monitor Instructions   Your physician has requested you wear your ZIO patch monitor__14_____days.   This is a single patch monitor.  [employer] supplies one patch monitor per enrollment.  Additional stickers are not available.   Please do not apply patch if you will be having a Nuclear Stress Test, Echocardiogram, Cardiac CT, MRI, or Chest Xray during the time frame you would be wearing the monitor. The patch cannot be worn during these tests.  You cannot remove and re-apply the ZIO XT patch monitor.   Your ZIO patch monitor will be sent USPS Priority mail from [facility] directly to your home address. The monitor may also be mailed to a PO BOX if home delivery is not available.   It may take 3-5 days to receive your monitor after you have been enrolled.   Once you have received you monitor, please review enclosed instructions.  Your monitor has already been registered assigning a specific monitor serial # to you.   Applying the monitor   Shave hair from upper left chest.   Hold abrader disc by orange tab.  Rub abrader in 40 strokes over left upper chest as indicated in your monitor instructions.   Clean area with 4 enclosed alcohol pads .  Use all pads to assure are is cleaned thoroughly.  Let dry.   Apply patch as indicated in monitor instructions.  Patch will be place under collarbone on left side of chest with arrow pointing upward.   Rub patch adhesive wings for 2 minutes.Remove white label marked "1".  Remove white label marked "2".  Rub patch adhesive wings for 2 additional minutes.   While looking in a mirror, press and release button in center of patch.  A small green light will flash 3-4 times .  This will be your only indicator the monitor has been turned on.     Do not shower for the first 24 hours.  You may shower after the first 24 hours.  Press button if you feel a symptom. You will hear a small click.  Record Date, Time and  Symptom in the Patient Log Book.   When you are ready to remove patch, follow instructions on last 2 pages of Patient Log Book.  Stick patch monitor onto last page of Patient Log Book.   Place Patient Log Book in [place] box.  Use locking tab on box and tape box closed securely.  The [employer] and [employer] has [employer] on it.  Please place in mailbox as soon as possible.  Your physician should have your test results approximately 7 days after the monitor has been mailed back to [facility].   Call [facility] Customer Care at [phone] if you have questions regarding your ZIO XT patch monitor.  Call them immediately if you see an orange light blinking on your monitor.   If your monitor falls off in less than 4 days contact our Monitor department at [phone].  If your monitor becomes loose or falls off after 4 days call [employer] at [phone] for suggestions on securing your monitor.    Follow-Up: At [facility], you and your health needs are our priority.  As part of our continuing mission to provide you with exceptional heart care, we have created designated Provider Care Teams.  These Care Teams include your primary Cardiologist (physician) and Advanced Practice Providers (APPs -  Physician Assistants and Nurse Practitioners) who all work together to provide you with the care you need, when you need it.  We recommend signing up for the patient portal called "MyChart".  Sign up information is provided on this After Visit Summary.  MyChart is used to connect with patients for Virtual Visits (Telemedicine).  Patients are able to view lab/test results, encounter notes, upcoming appointments, etc.  Non-urgent messages can be sent to your provider as well.   To learn more about what you can do with MyChart, go to [email or web address].    Your next appointment:   PENDING TEST RESULTS   The format for your next appointment:   In Person  Provider:   Maisie Fus, MD            Signed, Maisie Fus, MD  07/28/2022 8:48 AM    [facility]

## 2022-07-28 NOTE — Telephone Encounter (Signed)
Spoke with pt and was able to inform her of her lab results. 

## 2022-08-02 ENCOUNTER — Ambulatory Visit (HOSPITAL_COMMUNITY): Payer: Medicare Other | Attending: Internal Medicine

## 2022-08-02 ENCOUNTER — Telehealth: Payer: Self-pay | Admitting: Internal Medicine

## 2022-08-02 DIAGNOSIS — R9431 Abnormal electrocardiogram [ECG] [EKG]: Secondary | ICD-10-CM | POA: Insufficient documentation

## 2022-08-02 DIAGNOSIS — R002 Palpitations: Secondary | ICD-10-CM | POA: Insufficient documentation

## 2022-08-02 LAB — ECHOCARDIOGRAM COMPLETE
Area-P 1/2: 4.19 cm2
S' Lateral: 3.2 cm

## 2022-08-02 NOTE — Telephone Encounter (Signed)
Patient is waiting to be scheduled for her MRI - Patient can not wear her heart monitor until she get the MRI - Please advise.

## 2022-08-02 NOTE — Telephone Encounter (Signed)
Referral done in June to PT. I do not see that she has been assessed by them. Maybe someone from referrals can help

## 2022-08-02 NOTE — Telephone Encounter (Signed)
Patient also needs to have her motorized scooter order sent to someone besides [employer] [facility] - [employer] does not accept her insurance any longer.

## 2022-08-03 ENCOUNTER — Telehealth: Payer: Self-pay | Admitting: Internal Medicine

## 2022-08-03 ENCOUNTER — Encounter: Payer: Self-pay | Admitting: Cardiology

## 2022-08-03 NOTE — Telephone Encounter (Signed)
Error

## 2022-08-03 NOTE — Telephone Encounter (Signed)
Informed patient of echo results. She will have brain MRI on 8/22, then she will use the zio patch. Instructions reviewed and mailed to her. Patient stated that after visiting a health fair, she was told not to take metoprolol succinate 160m twice a day; to only take it once a day. She has been taking met succ 1051mdaily for the past 2 months. Patient has, on occasional, palpitations at night. She does not check BP or P. Recommended that she take the met succ as prescribed and check her BP and P daily. She will contact clinic if P goes below 60 or if SBP is sustained around 110.

## 2022-08-03 NOTE — Telephone Encounter (Signed)
Patient is returning call to discuss echo results. °

## 2022-08-09 ENCOUNTER — Ambulatory Visit: Payer: Medicare Other | Admitting: Internal Medicine

## 2022-08-10 ENCOUNTER — Ambulatory Visit
Admission: RE | Admit: 2022-08-10 | Discharge: 2022-08-10 | Disposition: A | Discharge status: Home / Self Care | Payer: Medicare Other | Source: Ambulatory Visit | Attending: Family Medicine | Admitting: Family Medicine

## 2022-08-10 DIAGNOSIS — R42 Dizziness and giddiness: Secondary | ICD-10-CM | POA: Diagnosis not present

## 2022-08-10 DIAGNOSIS — R519 Headache, unspecified: Secondary | ICD-10-CM | POA: Diagnosis not present

## 2022-08-10 DIAGNOSIS — M47812 Spondylosis without myelopathy or radiculopathy, cervical region: Secondary | ICD-10-CM | POA: Diagnosis not present

## 2022-08-10 MED ORDER — GADOBENATE DIMEGLUMINE 529 MG/ML IV SOLN
20.0000 mL | Freq: Once | INTRAVENOUS | Status: AC | PRN
  Administered 2022-08-10: 20 mL via INTRAVENOUS

## 2022-08-11 NOTE — Progress Notes (Signed)
Please let Frances [name] know that Frances [name] brain MRI did not show anything to explain Frances [name] symptoms. I recommend that she follow-up with Dr. Okey Dupre in the next 2-4 weeks to discuss Frances [name] symptoms and to discuss potentially starting on a cholesterol medication

## 2022-09-14 ENCOUNTER — Telehealth: Payer: Self-pay | Admitting: Internal Medicine

## 2022-09-14 DIAGNOSIS — I480 Paroxysmal atrial fibrillation: Secondary | ICD-10-CM

## 2022-09-14 NOTE — Telephone Encounter (Signed)
LMVM- returning call to schedule and appointment to have your ZIO XT monitor applied at our [facility] office.  That would be at [place], [place], [place], [place].  Please call Sheneka Schrom in monitors at [phone] to schedule your appointment.

## 2022-09-14 NOTE — Telephone Encounter (Signed)
Patient calling to request an appointment to have her monitor put on.

## 2022-09-14 NOTE — Telephone Encounter (Signed)
Pt called to report an increase in palpations over the last few weeks.  She report symptoms happen roughly 3-4 times a day and will experience occasional dizziness. -Pt also report she's been experiencing chest tightness with exertion that resolve with rest. 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 Pt stated she is unable to go today as she has to be home when her grandson return from school. Nurse informed pt of risk and concerns, however pt declined again.  Nurse scheduled pt for an appointment tomorrow 9/27 for further evaluations. Pt also questioning if MD's nurse can assist with placing monitor

## 2022-09-14 NOTE — Telephone Encounter (Signed)
Patient c/o Palpitations:  High priority if patient c/o lightheadedness, shortness of breath, or chest pain  How long have you had palpitations/irregular HR/ Afib? Are you having the symptoms now? About a week, no  Are you currently experiencing lightheadedness, SOB or CP? Chest tightness, gets lightheadedness but has had that for years  Do you have a history of afib (atrial fibrillation) or irregular heart rhythm? yes  Have you checked your BP or HR? (document readings if available): no  Are you experiencing any other symptoms? no  Patient states she has noticed an increase in palpitations and racing heart rate. She says she also get tightness in her chest and is currently having it. She says she has been very busy and has not been about to get much sleep.

## 2022-09-15 ENCOUNTER — Ambulatory Visit: Payer: Medicare Other

## 2022-09-15 ENCOUNTER — Encounter: Payer: Self-pay | Admitting: Internal Medicine

## 2022-09-15 ENCOUNTER — Ambulatory Visit: Payer: Medicare Other | Admitting: Internal Medicine

## 2022-09-15 ENCOUNTER — Ambulatory Visit: Payer: Medicare Other | Attending: Internal Medicine | Admitting: Internal Medicine

## 2022-09-15 VITALS — BP 140/86 | HR 62 | Ht 63.0 in | Wt 206.0 lb

## 2022-09-15 DIAGNOSIS — R9431 Abnormal electrocardiogram [ECG] [EKG]: Secondary | ICD-10-CM | POA: Diagnosis not present

## 2022-09-15 DIAGNOSIS — Z01812 Encounter for preprocedural laboratory examination: Secondary | ICD-10-CM | POA: Diagnosis not present

## 2022-09-15 DIAGNOSIS — R072 Precordial pain: Secondary | ICD-10-CM

## 2022-09-15 DIAGNOSIS — R002 Palpitations: Secondary | ICD-10-CM

## 2022-09-15 NOTE — Progress Notes (Signed)
Cardiology Office Note:    Date:  09/15/2022   ID:  Frances [name], DOB 08/08/1946, MRN [id number]  PCP:  Myrlene Broker, MD   [facility] Providers Cardiologist:  Maisie Fus, MD     Referring MD: Myrlene Broker, *   No chief complaint on file. Dizziness/Pre-syncopal  History of Present Illness:    Frances [name] is a 76 y.o. female with a hx of anxiety, GERD, noted thyroid dx with normal TSH, had a near syncopal episode and cardiology referral was sent by her PCP.  EKG in clinic noted to be sinus bradycardia with PACs.  She notes that last Friday that she had a late breakfast. She felt fluttering while eating. She then felt presyncopal. She was left jittery. She noted a hx of bigeminy in 2006. She was started taking metoprolol XL 100 mg daily.  No syncopal events. She notes dizzy spells for years She has palpitations at night. She was scheduled for a brain MRI. She's been under a lot of stress with a grandson with autism.  She is retired. She lives with her daughter. She denies CP. No progressive dyspnea  Family Hx: GM had a stroke. Father passed from a stroke. Mother had HTN.  Interim Hx 09/15/2022: She comes in today for the last 2-3 days she noted palpitations.  Zio patch ordered prior, she is wearing it. Her echo was normal. TSH nl. She's been under stress and has chest tightness.  No orthopnea/PND, no significant LE edema.   Past Medical History:  Diagnosis Date   Anxiety    Arthritis    Blood in stool    Depression    GERD (gastroesophageal reflux disease)    Hypertension    Sleep apnea    Thyroid disease    UTI (urinary tract infection)     Past Surgical History:  Procedure Laterality Date   HERNIA REPAIR     umbilical repair    Current Medications: Current Meds  Medication Sig   amLODipine (NORVASC) 5 MG tablet Take 1 tablet (5 mg total) by mouth daily.   levothyroxine (SYNTHROID) 50 MCG tablet Take 1 tablet (50 mcg total) by  mouth daily before breakfast.   lisinopril (ZESTRIL) 40 MG tablet Take 1 tablet (40 mg total) by mouth daily.   metoprolol succinate (TOPROL-XL) 100 MG 24 hr tablet Take 1 tablet (100 mg total) by mouth 2 (two) times daily.   polyethylene glycol (MIRALAX / GLYCOLAX) packet Take 17 g by mouth daily as needed.   Salicylic Acid 2 % PADS Apply topically daily.   Vitamin D, Ergocalciferol, (DRISDOL) 1.25 MG (50000 UNIT) CAPS capsule Take 1 capsule (50,000 Units total) by mouth every 7 (seven) days.     Allergies:   Patient has no known allergies.   Social History   Socioeconomic History   Marital status: Divorced    Spouse name: Not on file   Number of children: 1   Years of education: Not on file   Highest education level: Not on file  Occupational History   Occupation: retired  Tobacco Use   Smoking status: Never   Smokeless tobacco: Never  Vaping Use   Vaping Use: Never used  Substance and Sexual Activity   Alcohol use: No    Alcohol/week: 0.0 standard drinks of alcohol   Drug use: No   Sexual activity: Not Currently  Other Topics Concern   Not on file  Social History Narrative   Not on file  Social Determinants of Health   Financial Resource Strain: Low Risk  (06/09/2021)   Overall Financial Resource Strain (CARDIA)    Difficulty of Paying Living Expenses: Not hard at all  Food Insecurity: No Food Insecurity (10/31/2019)   Hunger Vital Sign    Worried About Running Out of Food in the Last Year: Never true    Ran Out of Food in the Last Year: Never true  Transportation Needs: No Transportation Needs (10/31/2019)   PRAPARE - Hydrologist (Medical): No    Lack of Transportation (Non-Medical): No  Physical Activity: Inactive (10/31/2019)   Exercise Vital Sign    Days of Exercise per Week: 0 days    Minutes of Exercise per Session: 0 min  Stress: No Stress Concern Present (10/31/2019)   [place]    Feeling of Stress : Not at all  Social Connections: Moderately Integrated (10/31/2019)   Social Connection and Isolation Panel [NHANES]    Frequency of Communication with Friends and Family: More than three times a week    Frequency of Social Gatherings with Friends and Family: More than three times a week    Attends Religious Services: 1 to 4 times per year    Active Member of [employer] or Organizations: Not on file    Attends Archivist Meetings: 1 to 4 times per year    Marital Status: Divorced     Family History: The patient's family history includes Alcohol abuse in her father; Arthritis in her father, maternal grandfather, maternal grandmother, mother, paternal grandfather, and paternal grandmother; Stroke in her father and maternal grandmother. There is no history of Colon cancer, Esophageal cancer, Rectal cancer, or Stomach cancer.  ROS:   Please see the history of present illness.     All other systems reviewed and are negative.  EKGs/Labs/Other Studies Reviewed:    The following studies were reviewed today:   EKG:  EKG is  ordered today.  The ekg ordered today demonstrates   07/28/2022-Sinus brady with PACs  Recent Labs: 07/27/2022: ALT 11; BUN 14; Creatinine, Ser 0.97; Hemoglobin 13.6; Platelets 285.0; Potassium 3.9; Sodium 144; TSH 1.82   Recent Lipid Panel    Component Value Date/Time   CHOL 158 03/09/2022 1014   TRIG 97.0 03/09/2022 1014   HDL 43.30 03/09/2022 1014   CHOLHDL 4 03/09/2022 1014   VLDL 19.4 03/09/2022 1014   LDLCALC 95 03/09/2022 1014     Risk Assessment/Calculations:           Physical Exam:    VS:   Vitals:   09/15/22 1515  BP: (!) 140/86  Pulse: 62  SpO2: 99%       Wt Readings from Last 3 Encounters:  09/15/22 206 lb (93.4 kg)  07/28/22 208 lb 9.6 oz (94.6 kg)  07/27/22 205 lb (93 kg)     GEN:  Well nourished, well developed in no acute distress HEENT: Normal NECK: No JVD; No carotid  bruits LYMPHATICS: No lymphadenopathy CARDIAC: RRR, no murmurs, rubs, gallops RESPIRATORY:  Clear to auscultation without rales, wheezing or rhonchi  ABDOMEN: Soft, non-tender, non-distended MUSCULOSKELETAL:  No edema; No deformity  SKIN: Warm and dry NEUROLOGIC:  Alert and oriented x 3 PSYCHIATRIC:  Normal affect   ASSESSMENT:    Palpitations/CP: Her presyncopal event may have been related to low glucose. She notes persistent dizziness. She has off and on palpations. Improved today.  Zio is on now. Echo  was normal. Normal TSH. She is undergoing a lot of stress which is contributing. She notes chest tightness today. Will plan for a coronary CTA. Continue BB.   PLAN:    In order of problems listed above:  Coronary CTA with morph FU ziopatch Follow up 6 months         Medication Adjustments/Labs and Tests Ordered: Current medicines are reviewed at length with the patient today.  Concerns regarding medicines are outlined above.  Orders Placed This Encounter  Procedures   CT CORONARY MORPH W/CTA COR W/SCORE W/CA W/CM &/OR WO/CM   Basic Metabolic Panel (BMET)   No orders of the defined types were placed in this encounter.   Patient Instructions  Medication Instructions:  Your physician recommends that you continue on your current medications as directed. Please refer to the Current Medication list given to you today.  *If you need a refill on your cardiac medications before your next appointment, please call your pharmacy*   Lab Work: TODAY: BMET If you have labs (blood work) drawn today and your tests are completely normal, you will receive your results only by: MyChart Message (if you have MyChart) OR A paper copy in the mail If you have any lab test that is abnormal or we need to change your treatment, we will call you to review the results.   Testing/Procedures:   Your cardiac CT will be scheduled at one of the below locations:   [facility] [address] [place], [place] [address] [phone]   If scheduled at [facility], please arrive at the [facility] and Children's Entrance (Entrance C2) of [facility] 30 minutes prior to test start time. You can use the FREE valet parking offered at entrance C (encouraged to control the heart rate for the test)  Proceed to the [facility] Radiology Department (first floor) to check-in and test prep.  All radiology patients and guests should use entrance C2 at [facility], accessed from [facility], even though the hospital's physical address listed is [address].      Please follow these instructions carefully (unless otherwise directed):   On the Night Before the Test: Be sure to Drink plenty of water. Do not consume any caffeinated/decaffeinated beverages or chocolate 12 hours prior to your test. Do not take any antihistamines 12 hours prior to your test.  On the Day of the Test: Drink plenty of water until 1 hour prior to the test. You may take your regular medications prior to the test.  Take metoprolol (Toprol-XL) before scan - at normal time.  FEMALES- please wear underwire-free bra if available, avoid dresses & tight clothing   After the Test: Drink plenty of water. After receiving IV contrast, you may experience a mild flushed feeling. This is normal. On occasion, you may experience a mild rash up to 24 hours after the test. This is not dangerous. If this occurs, you can take Benadryl 25 mg and increase your fluid intake. If you experience trouble breathing, this can be serious. If it is severe call 911 IMMEDIATELY. If it is mild, please call our office. If you take any of these medications: Glipizide/Metformin, Avandament, Glucavance, please do not take 48 hours after completing test unless otherwise instructed.  We will call to schedule your test 2-4 weeks out understanding that some insurance companies will need an  authorization prior to the service being performed.   For non-scheduling related questions, please contact the  cardiac imaging nurse navigator should you have any questions/concerns: Rockwell Alexandria, Cardiac Imaging Nurse Navigator Larey Brick, Cardiac Imaging Nurse Navigator [facility] Direct Office Dial: [phone]   For scheduling needs, including cancellations and rescheduling, please call [place], [phone].    Follow-Up: At [facility], you and your health needs are our priority.  As part of our continuing mission to provide you with exceptional heart care, we have created designated Provider Care Teams.  These Care Teams include your primary Cardiologist (physician) and Advanced Practice Providers (APPs -  Physician Assistants and Nurse Practitioners) who all work together to provide you with the care you need, when you need it.  We recommend signing up for the patient portal called "MyChart".  Sign up information is provided on this After Visit Summary.  MyChart is used to connect with patients for Virtual Visits (Telemedicine).  Patients are able to view lab/test results, encounter notes, upcoming appointments, etc.  Non-urgent messages can be sent to your provider as well.   To learn more about what you can do with MyChart, go to [email or web address].    Your next appointment:   6 month(s)  The format for your next appointment:   In Person  Provider:   Maisie Fus, MD     Other Instructions   Important Information About Sugar         Signed, Maisie Fus, MD  09/15/2022 4:19 PM    [facility]

## 2022-09-15 NOTE — Patient Instructions (Addendum)
Medication Instructions:  Your physician recommends that you continue on your current medications as directed. Please refer to the Current Medication list given to you today.  *If you need a refill on your cardiac medications before your next appointment, please call your pharmacy*   Lab Work: TODAY: BMET If you have labs (blood work) drawn today and your tests are completely normal, you will receive your results only by: MyChart Message (if you have MyChart) OR A paper copy in the mail If you have any lab test that is abnormal or we need to change your treatment, we will call you to review the results.   Testing/Procedures:   Your cardiac CT will be scheduled at one of the below locations:   [facility] [address] [place], [place] [address] [phone]   If scheduled at [facility], please arrive at the [facility] and Children's Entrance (Entrance C2) of [facility] 30 minutes prior to test start time. You can use the FREE valet parking offered at entrance C (encouraged to control the heart rate for the test)  Proceed to the [facility] Radiology Department (first floor) to check-in and test prep.  All radiology patients and guests should use entrance C2 at [facility], accessed from [facility], even though the hospital's physical address listed is [address].      Please follow these instructions carefully (unless otherwise directed):   On the Night Before the Test: Be sure to Drink plenty of water. Do not consume any caffeinated/decaffeinated beverages or chocolate 12 hours prior to your test. Do not take any antihistamines 12 hours prior to your test.  On the Day of the Test: Drink plenty of water until 1 hour prior to the test. You may take your regular medications prior to the test.  Take metoprolol (Toprol-XL) before scan - at normal time.  FEMALES- please wear underwire-free bra if available, avoid  dresses & tight clothing   After the Test: Drink plenty of water. After receiving IV contrast, you may experience a mild flushed feeling. This is normal. On occasion, you may experience a mild rash up to 24 hours after the test. This is not dangerous. If this occurs, you can take Benadryl 25 mg and increase your fluid intake. If you experience trouble breathing, this can be serious. If it is severe call 911 IMMEDIATELY. If it is mild, please call our office. If you take any of these medications: Glipizide/Metformin, Avandament, Glucavance, please do not take 48 hours after completing test unless otherwise instructed.  We will call to schedule your test 2-4 weeks out understanding that some insurance companies will need an authorization prior to the service being performed.   For non-scheduling related questions, please contact the cardiac imaging nurse navigator should you have any questions/concerns: Rockwell Alexandria, Cardiac Imaging Nurse Navigator Larey Brick, Cardiac Imaging Nurse Navigator [facility] Direct Office Dial: [phone]   For scheduling needs, including cancellations and rescheduling, please call [place], [phone].    Follow-Up: At [facility], you and your health needs are our priority.  As part of our continuing mission to provide you with exceptional heart care, we have created designated Provider Care Teams.  These Care Teams include your primary Cardiologist (physician) and Advanced Practice Providers (APPs -  Physician Assistants and Nurse Practitioners) who all work together to provide you with the care you need, when you need it.  We recommend signing up for  the patient portal called "MyChart".  Sign up information is provided on this After Visit Summary.  MyChart is used to connect with patients for Virtual Visits (Telemedicine).  Patients are able to view lab/test results, encounter notes, upcoming appointments, etc.   Non-urgent messages can be sent to your provider as well.   To learn more about what you can do with MyChart, go to [email or web address].    Your next appointment:   6 month(s)  The format for your next appointment:   In Person  Provider:   Janina Mayo, MD     Other Instructions   Important Information About Sugar

## 2022-09-16 LAB — BASIC METABOLIC PANEL
BUN/Creatinine Ratio: 11 — ABNORMAL LOW (ref 12–28)
BUN: 13 mg/dL (ref 8–27)
CO2: 26 mmol/L (ref 20–29)
Calcium: 9.6 mg/dL (ref 8.7–10.3)
Chloride: 106 mmol/L (ref 96–106)
Creatinine, Ser: 1.2 mg/dL — ABNORMAL HIGH (ref 0.57–1.00)
Glucose: 79 mg/dL (ref 70–99)
Potassium: 4.4 mmol/L (ref 3.5–5.2)
Sodium: 146 mmol/L — ABNORMAL HIGH (ref 134–144)
eGFR: 47 mL/min/{1.73_m2} — ABNORMAL LOW (ref >59)

## 2022-10-04 ENCOUNTER — Ambulatory Visit (HOSPITAL_COMMUNITY): Admission: RE | Admit: 2022-10-04 | Payer: Medicare Other | Source: Ambulatory Visit

## 2022-10-05 DIAGNOSIS — R9431 Abnormal electrocardiogram [ECG] [EKG]: Secondary | ICD-10-CM | POA: Diagnosis not present

## 2022-10-05 DIAGNOSIS — R002 Palpitations: Secondary | ICD-10-CM | POA: Diagnosis not present

## 2022-10-12 ENCOUNTER — Telehealth: Payer: Self-pay | Admitting: Internal Medicine

## 2022-10-12 MED ORDER — APIXABAN 5 MG PO TABS: 5.0000 mg | ORAL_TABLET | Freq: Two times a day (BID) | ORAL | 0 refills | Status: DC

## 2022-10-12 NOTE — Telephone Encounter (Signed)
Discussed ok to defer CT scan

## 2022-10-12 NOTE — Telephone Encounter (Signed)
Spoke with Frances [name] about her ziopatch. She has atrial fibrillation. Sending in eliquis. She can continue her BB. She has persistent dizziness. Notes dizziness with changing in position and loss of balance. Discussed BPPV +/- neurology consult. Will route to her PCP.

## 2022-10-15 ENCOUNTER — Telehealth: Payer: Self-pay | Admitting: Internal Medicine

## 2022-10-15 NOTE — Telephone Encounter (Signed)
Pt c/o medication issue:  1. Name of Medication: apixaban (ELIQUIS) 5 MG TABS tablet  2. How are you currently taking this medication (dosage and times per day)? Take 1 tablet (5 mg total) by mouth 2 (two) times daily.  3. Are you having a reaction (difficulty breathing--STAT)? no  4. What is your medication issue? Patient cant afford the medication. Calling to see what other options are there. Please advise

## 2022-10-15 NOTE — Telephone Encounter (Signed)
Called patient, advised that it was recommended she be on Eliquis, patient states she still has not started the medication as she is on a fixed income and can not afford it. She would like to go back on just Aspirin, however I did advise per monitor results it looked as if this would not be enough, however I would send a message to Dr.Branch.  I offered patient assistance information, she will come pick up on Monday morning.  I have printed and placed in triage with her information on it.   Patient verbalized understanding. Will route to RN as Juluis Rainier.  Thanks!

## 2022-10-19 ENCOUNTER — Telehealth: Payer: Self-pay | Admitting: Internal Medicine

## 2022-10-19 NOTE — Telephone Encounter (Signed)
Called patient left message on personal voice mail I will leave Eliquis patient assistance paperwork at [facility] office front desk.

## 2022-10-19 NOTE — Telephone Encounter (Signed)
Pt calling to notify nurse that she will be coming by the office today to get Pt Assistance paperwork. Pt was supposed to stop by yesterday but was unable to make it. Please advise

## 2022-10-20 NOTE — Telephone Encounter (Signed)
Can we submit patient information for enrollment in [employer] trial?

## 2022-10-20 NOTE — Telephone Encounter (Signed)
She can apply for patient assistance, but she will have had to spend 3% or more of her household income on Rx expenses this year to qualify.  Her other options would be to see if she would like to see if she qualifies for a clinical trial of a new blood thinner vs Eliquis. If she can be enrolled she would  get free medication for up to 3 years. Last option would be warfarin which would be much cheaper but require that she come to appointments to monitor her INR.

## 2022-10-22 NOTE — Telephone Encounter (Signed)
Called patient left PharmD's advice on personal voice mail. 

## 2022-10-22 NOTE — Telephone Encounter (Signed)
Spoke to patient, patient aware of options.  Advised message was sent to pharmD to call to discuss trial.   She will call patient assistance to find out what her out of pocket expense is to qualify.  Will check for samples + 30 day card and let patient know on Monday.  Patient request message be left if no answer.

## 2022-10-22 NOTE — Telephone Encounter (Signed)
Patient is returning call.  °

## 2022-10-25 NOTE — Telephone Encounter (Signed)
No samples available-30 day card left at front desk.   Left message for patient to make aware.

## 2022-11-29 ENCOUNTER — Telehealth: Payer: Self-pay | Admitting: Internal Medicine

## 2022-11-29 NOTE — Telephone Encounter (Signed)
Patient is requesting a status update on patient assistance application for Eliquis.

## 2022-11-29 NOTE — Telephone Encounter (Signed)
Returned call to patient-advised Dr. Wyline Mood out of office last week but back in office this week, will complete and fax to company.  Patient aware.

## 2022-12-02 NOTE — Telephone Encounter (Signed)
Attempted to call patient, unable to reach. Left message (okay per DPR) that forms have been faxed to call back with any concerns.

## 2022-12-02 NOTE — Telephone Encounter (Signed)
Late Entry- patient assistance has been faxed to patient assistance foundation.

## 2023-01-17 NOTE — Telephone Encounter (Signed)
Pt is requesting call back to get an update on patient assistance. Please advise.

## 2023-01-17 NOTE — Telephone Encounter (Signed)
Routed to primary nurse for follow up

## 2023-01-18 ENCOUNTER — Telehealth: Payer: Self-pay | Admitting: Internal Medicine

## 2023-01-18 NOTE — Telephone Encounter (Signed)
Patient is calling back regarding an update for her pt assistance forms due to not hearing back yesterday. Please advise.

## 2023-01-18 NOTE — Telephone Encounter (Signed)
Attempted to call patient, left message for patient to call back to office.   Have not heard back from patient assistance- everything was faxed to assistance program back in December- will need to see if patient has received any information on assistance forms.

## 2023-01-19 NOTE — Telephone Encounter (Signed)
Please see duplicate encounter

## 2023-01-24 ENCOUNTER — Telehealth: Payer: Self-pay

## 2023-01-24 NOTE — Telephone Encounter (Signed)
Called pt following this message: Good Afternoon,    I just spoke to [place] on rescheduling her cardiac ct scan. She do not remember this order being place. Do she still need this scan done? If so can someone give her a call about this scan.   Thanks,  [place]  I spoke with the pt, when asked does she rememebr going over the instructions for the CT scan she states "No I do not recall that." Pt made aware I was the nurse who went over the instructions with her. She states "I do not remember that at all." Pt made aware Dr. Harl Bowie wants the test done due to her having chest tightness when she was in office. Pt states "I don't remember that and we are in the middle of switching over insurances right now so I don't know what the co-pay is. I can't do it right now but when I have more information about that than I will make  decision." Pt also asked about Eliquis paperwork. I informed pt I did not have the paperwork but I could ask Eliezer Lofts, who she mentioned in the conversation.I also give the patient the number to [place] to enquire about her application. He thanked me for giving her the number.

## 2023-02-01 ENCOUNTER — Telehealth: Payer: Self-pay | Admitting: Internal Medicine

## 2023-02-01 NOTE — Telephone Encounter (Signed)
Patient calling in with questions/concerns bout getting a CT. Also she want to discuss the paperwork to be able to get patient assistant. Please advise

## 2023-02-01 NOTE — Telephone Encounter (Signed)
Patient stated she received a call to schedule CCTA, which she cancelled in Oct. She didn't remember it ever being ordered. Will she need BMET and lopressor ordered? She also stated she needs PAP for eliquis. She has not taken eliquis for fear of starting it and not getting approved for assistance. She also said she needs dental work and wanted to wait before taking eliquis. She also reported dysphagia and tightness when swallowing. Recommended she contact he GI or PCP. Please advise on CCTA orders.

## 2023-02-02 NOTE — Telephone Encounter (Signed)
Spoke to patient who states she is still working with patient assistance regarding her Eliquis- patient reports that she has one more paper to fax and will get that sent in. Patient states that she has not started it yet due to costs- reports she does have some at home but states she didn't want to start and not be able to continue getting the medication. Patient reports that she is also having dental work and didn't know if she should wait until after having the dental work to proceed. Patient would like to know if she should go ahead and start the Eliquis- advised patient of the risks associated with Atrial fib and patient verbalized understanding. Patient also wants to know if she needs to have the CCTA- advised her that this was ordered chest pain- patient would like to check with Dr. Harl Bowie- will forward message over to her. Patient verbalized understanding.

## 2023-02-02 NOTE — Telephone Encounter (Signed)
Please see other Telephone enocunter

## 2023-02-04 NOTE — Telephone Encounter (Signed)
Yes, I do think she should start her Eliquis. If she is not approved for patient assistance then we will have to get her transitioned to warfarin.  As far as the dental work. It depends on what she is having done. If it is just a cleaning then there is no need to hold her Eliquis at all. Would need to know what they are doing to fully answer that question. I cannot answer the CTA question.

## 2023-02-18 NOTE — Telephone Encounter (Signed)
Left message for pt to call if she continues to have symptoms, otherwise; no CT at this time.

## 2023-03-03 ENCOUNTER — Other Ambulatory Visit: Payer: Self-pay | Admitting: Internal Medicine

## 2023-03-03 DIAGNOSIS — I10 Essential (primary) hypertension: Secondary | ICD-10-CM

## 2023-03-03 DIAGNOSIS — E039 Hypothyroidism, unspecified: Secondary | ICD-10-CM

## 2023-03-07 ENCOUNTER — Ambulatory Visit: Payer: Medicare HMO | Admitting: Internal Medicine

## 2023-03-08 ENCOUNTER — Telehealth: Payer: Self-pay

## 2023-03-08 NOTE — Telephone Encounter (Signed)
Called patient to schedule Medicare Annual Wellness Visit (AWV). Left message for patient to call back and schedule Medicare Annual Wellness Visit (AWV).  Last date of AWV: 03/09/22  Please schedule an appointment at any time.  Norton Blizzard, [place] ([facility])  [facility] [phone]

## 2023-03-08 NOTE — Telephone Encounter (Signed)
Contacted Cheryl-Ann Oakley to schedule their annual wellness visit. Appointment made for 03/15/23.  Norton Blizzard, [place] ([facility])  [facility] [phone]

## 2023-03-15 ENCOUNTER — Ambulatory Visit (INDEPENDENT_AMBULATORY_CARE_PROVIDER_SITE_OTHER): Payer: Medicare HMO

## 2023-03-15 VITALS — Ht 63.0 in | Wt 206.0 lb

## 2023-03-15 DIAGNOSIS — Z0000 Encounter for general adult medical examination without abnormal findings: Secondary | ICD-10-CM | POA: Diagnosis not present

## 2023-03-15 NOTE — Patient Instructions (Signed)
Frances [name] , Thank you for taking time to come for your Medicare Wellness Visit. I appreciate your ongoing commitment to your health goals. Please review the following plan we discussed and let me know if I can assist you in the future.   These are the goals we discussed:  Goals      Patient Stated     I want to listen to my gospel music and take time to read the bible daily.        This is a list of the screening recommended for you and due dates:  Health Maintenance  Topic Date Due   Mammogram  Never done   Zoster (Shingles) Vaccine (1 of 2) Never done   DEXA scan (bone density measurement)  Never done   COVID-19 Vaccine (3 - 2023-24 season) 08/20/2022   Medicare Annual Wellness Visit  03/14/2024   DTaP/Tdap/Td vaccine (3 - Td or Tdap) 09/25/2028   Colon Cancer Screening  02/01/2029   Pneumonia Vaccine  Completed   Flu Shot  Completed   Hepatitis C Screening: USPSTF Recommendation to screen - Ages 18-79 yo.  Completed   HPV Vaccine  Aged Out    Advanced directives: Forms are available if you choose in the future to pursue completion.  This is recommended in order to make sure that your health wishes are honored in the event that you are unable to verbalize them to the provider.   Conditions/risks identified: Aim for 30 minutes of exercise or brisk walking, 6-8 glasses of water, and 5 servings of fruits and vegetables each day.   Next appointment: Follow up in one year for your annual wellness visit    Preventive Care 65 Years and Older, Female Preventive care refers to lifestyle choices and visits with your health care provider that can promote health and wellness. What does preventive care include? A yearly physical exam. This is also called an annual well check. Dental exams once or twice a year. Routine eye exams. Ask your health care provider how often you should have your eyes checked. Personal lifestyle choices, including: Daily care of your teeth and gums. Regular  physical activity. Eating a healthy diet. Avoiding tobacco and drug use. Limiting alcohol use. Practicing safe sex. Taking low-dose aspirin every day. Taking vitamin and mineral supplements as recommended by your health care provider. What happens during an annual well check? The services and screenings done by your health care provider during your annual well check will depend on your age, overall health, lifestyle risk factors, and family history of disease. Counseling  Your health care provider may ask you questions about your: Alcohol use. Tobacco use. Drug use. Emotional well-being. Home and relationship well-being. Sexual activity. Eating habits. History of falls. Memory and ability to understand (cognition). Work and work Statistician. Reproductive health. Screening  You may have the following tests or measurements: Height, weight, and BMI. Blood pressure. Lipid and cholesterol levels. These may be checked every 5 years, or more frequently if you are over 31 years old. Skin check. Lung cancer screening. You may have this screening every year starting at age 70 if you have a 30-pack-year history of smoking and currently smoke or have quit within the past 15 years. Fecal occult blood test (FOBT) of the stool. You may have this test every year starting at age 64. Flexible sigmoidoscopy or colonoscopy. You may have a sigmoidoscopy every 5 years or a colonoscopy every 10 years starting at age 70. Hepatitis C blood test. Hepatitis  B blood test. Sexually transmitted disease (STD) testing. Diabetes screening. This is done by checking your blood sugar (glucose) after you have not eaten for a while (fasting). You may have this done every 1-3 years. Bone density scan. This is done to screen for osteoporosis. You may have this done starting at age 58. Mammogram. This may be done every 1-2 years. Talk to your health care provider about how often you should have regular mammograms. Talk  with your health care provider about your test results, treatment options, and if necessary, the need for more tests. Vaccines  Your health care provider may recommend certain vaccines, such as: Influenza vaccine. This is recommended every year. Tetanus, diphtheria, and acellular pertussis (Tdap, Td) vaccine. You may need a Td booster every 10 years. Zoster vaccine. You may need this after age 50. Pneumococcal 13-valent conjugate (PCV13) vaccine. One dose is recommended after age 35. Pneumococcal polysaccharide (PPSV23) vaccine. One dose is recommended after age 70. Talk to your health care provider about which screenings and vaccines you need and how often you need them. This information is not intended to replace advice given to you by your health care provider. Make sure you discuss any questions you have with your health care provider. Document Released: 01/02/2016 Document Revised: 08/25/2016 Document Reviewed: 10/07/2015 Elsevier Interactive Patient Education  2017 [place] Prevention in the Home Falls can cause injuries. They can happen to people of all ages. There are many things you can do to make your home safe and to help prevent falls. What can I do on the outside of my home? Regularly fix the edges of walkways and driveways and fix any cracks. Remove anything that might make you trip as you walk through a door, such as a raised step or threshold. Trim any bushes or trees on the path to your home. Use bright outdoor lighting. Clear any walking paths of anything that might make someone trip, such as rocks or tools. Regularly check to see if handrails are loose or broken. Make sure that both sides of any steps have handrails. Any raised decks and porches should have guardrails on the edges. Have any leaves, snow, or ice cleared regularly. Use sand or salt on walking paths during winter. Clean up any spills in your garage right away. This includes oil or grease  spills. What can I do in the bathroom? Use night lights. Install grab bars by the toilet and in the tub and shower. Do not use towel bars as grab bars. Use non-skid mats or decals in the tub or shower. If you need to sit down in the shower, use a plastic, non-slip stool. Keep the floor dry. Clean up any water that spills on the floor as soon as it happens. Remove soap buildup in the tub or shower regularly. Attach bath mats securely with double-sided non-slip rug tape. Do not have throw rugs and other things on the floor that can make you trip. What can I do in the bedroom? Use night lights. Make sure that you have a light by your bed that is easy to reach. Do not use any sheets or blankets that are too big for your bed. They should not hang down onto the floor. Have a firm chair that has side arms. You can use this for support while you get dressed. Do not have throw rugs and other things on the floor that can make you trip. What can I do in the kitchen? Clean up any  spills right away. Avoid walking on wet floors. Keep items that you use a lot in easy-to-reach places. If you need to reach something above you, use a strong step stool that has a grab bar. Keep electrical cords out of the way. Do not use floor polish or wax that makes floors slippery. If you must use wax, use non-skid floor wax. Do not have throw rugs and other things on the floor that can make you trip. What can I do with my stairs? Do not leave any items on the stairs. Make sure that there are handrails on both sides of the stairs and use them. Fix handrails that are broken or loose. Make sure that handrails are as long as the stairways. Check any carpeting to make sure that it is firmly attached to the stairs. Fix any carpet that is loose or worn. Avoid having throw rugs at the top or bottom of the stairs. If you do have throw rugs, attach them to the floor with carpet tape. Make sure that you have a light switch at the  top of the stairs and the bottom of the stairs. If you do not have them, ask someone to add them for you. What else can I do to help prevent falls? Wear shoes that: Do not have high heels. Have rubber bottoms. Are comfortable and fit you well. Are closed at the toe. Do not wear sandals. If you use a stepladder: Make sure that it is fully opened. Do not climb a closed stepladder. Make sure that both sides of the stepladder are locked into place. Ask someone to hold it for you, if possible. Clearly mark and make sure that you can see: Any grab bars or handrails. First and last steps. Where the edge of each step is. Use tools that help you move around (mobility aids) if they are needed. These include: Canes. Walkers. Scooters. Crutches. Turn on the lights when you go into a dark area. Replace any light bulbs as soon as they burn out. Set up your furniture so you have a clear path. Avoid moving your furniture around. If any of your floors are uneven, fix them. If there are any pets around you, be aware of where they are. Review your medicines with your doctor. Some medicines can make you feel dizzy. This can increase your chance of falling. Ask your doctor what other things that you can do to help prevent falls. This information is not intended to replace advice given to you by your health care provider. Make sure you discuss any questions you have with your health care provider. Document Released: 10/02/2009 Document Revised: 05/13/2016 Document Reviewed: 01/10/2015 Elsevier Interactive Patient Education  2017 [employer].

## 2023-03-15 NOTE — Progress Notes (Signed)
Subjective:   Frances [name] is a 77 y.o. female who presents for Medicare Annual (Subsequent) preventive examination.  I connected with  Frances [name] on 03/15/23 by a audio enabled telemedicine application and verified that I am speaking with the correct person using two identifiers.  Patient Location: Home  Provider Location: Home Office  I discussed the limitations of evaluation and management by telemedicine. The patient expressed understanding and agreed to proceed.  Review of Systems     Cardiac Risk Factors include: advanced age (>64men, >86 women);sedentary lifestyle;hypertension     Objective:    Today's Vitals   03/15/23 0949  Weight: 206 lb (93.4 kg)  Height: 5\' 3"  (1.6 m)   Body mass index is 36.49 kg/m.     03/15/2023    7:54 PM 10/31/2019   12:56 PM  Advanced Directives  Does Patient Have a Medical Advance Directive? No No  Does patient want to make changes to medical advance directive?  Yes (ED - Information included in AVS)  Would patient like information on creating a medical advance directive? No - Patient declined     Current Medications (verified) Outpatient Encounter Medications as of 03/15/2023  Medication Sig   amLODipine (NORVASC) 5 MG tablet TAKE 1 TABLET (5 MG TOTAL) BY MOUTH DAILY.   levothyroxine (SYNTHROID) 50 MCG tablet TAKE 1 TABLET BY MOUTH DAILY BEFORE BREAKFAST   lisinopril (ZESTRIL) 40 MG tablet Take 1 tablet (40 mg total) by mouth daily.   metoprolol succinate (TOPROL-XL) 100 MG 24 hr tablet TAKE 1 TABLET BY MOUTH TWICE A DAY   polyethylene glycol (MIRALAX / GLYCOLAX) packet Take 17 g by mouth daily as needed.   Salicylic Acid 2 % PADS Apply topically daily.   [DISCONTINUED] Vitamin D, Ergocalciferol, (DRISDOL) 1.25 MG (50000 UNIT) CAPS capsule Take 1 capsule (50,000 Units total) by mouth every 7 (seven) days.   apixaban (ELIQUIS) 5 MG TABS tablet Take 1 tablet (5 mg total) by mouth 2 (two) times daily. (Patient not taking:  Reported on 03/15/2023)   No facility-administered encounter medications on file as of 03/15/2023.    Allergies (verified) Patient has no known allergies.   History: Past Medical History:  Diagnosis Date   Anxiety    Arthritis    Blood in stool    Depression    GERD (gastroesophageal reflux disease)    Hypertension    Sleep apnea    Thyroid disease    UTI (urinary tract infection)    Past Surgical History:  Procedure Laterality Date   HERNIA REPAIR     umbilical repair   Family History  Problem Relation Age of Onset   Arthritis Mother    Alcohol abuse Father    Arthritis Father    Stroke Father    Arthritis Maternal Grandmother    Stroke Maternal Grandmother    Arthritis Maternal Grandfather    Arthritis Paternal Grandmother    Arthritis Paternal Grandfather    Colon cancer Neg Hx    Esophageal cancer Neg Hx    Rectal cancer Neg Hx    Stomach cancer Neg Hx    Social History   Socioeconomic History   Marital status: Divorced    Spouse name: Not on file   Number of children: 1   Years of education: Not on file   Highest education level: Not on file  Occupational History   Occupation: retired  Tobacco Use   Smoking status: Never   Smokeless tobacco: Never  Media planner  Vaping Use: Never used  Substance and Sexual Activity   Alcohol use: No    Alcohol/week: 0.0 standard drinks of alcohol   Drug use: No   Sexual activity: Not Currently  Other Topics Concern   Not on file  Social History Narrative   Not on file   Social Determinants of Health   Financial Resource Strain: Low Risk  (03/15/2023)   Overall Financial Resource Strain (CARDIA)    Difficulty of Paying Living Expenses: Not hard at all  Food Insecurity: No Food Insecurity (03/15/2023)   Hunger Vital Sign    Worried About Running Out of Food in the Last Year: Never true    Ran Out of Food in the Last Year: Never true  Transportation Needs: No Transportation Needs (03/15/2023)   PRAPARE -  Hydrologist (Medical): No    Lack of Transportation (Non-Medical): No  Physical Activity: Inactive (03/15/2023)   Exercise Vital Sign    Days of Exercise per Week: 0 days    Minutes of Exercise per Session: 0 min  Stress: No Stress Concern Present (03/15/2023)   [place]    Feeling of Stress : Not at all  Social Connections: Moderately Isolated (03/15/2023)   Social Connection and Isolation Panel [NHANES]    Frequency of Communication with Friends and Family: Three times a week    Frequency of Social Gatherings with Friends and Family: Three times a week    Attends Religious Services: 1 to 4 times per year    Active Member of Clubs or Organizations: No    Attends Archivist Meetings: Never    Marital Status: Divorced    Tobacco Counseling Counseling given: Not Answered   Clinical Intake:  Pre-visit preparation completed: Yes  Pain : No/denies pain  Diabetes: No  How often do you need to have someone help you when you read instructions, pamphlets, or other written materials from your doctor or pharmacy?: 1 - Never  Diabetic?No   Interpreter Needed?: No  Information entered by :: Denman George LPN   Activities of Daily Living    03/15/2023    7:54 PM  In your present state of health, do you have any difficulty performing the following activities:  Hearing? 0  Vision? 0  Difficulty concentrating or making decisions? 0  Walking or climbing stairs? 0  Dressing or bathing? 0  Doing errands, shopping? 0  Preparing Food and eating ? N  Using the Toilet? N  In the past six months, have you accidently leaked urine? N  Do you have problems with loss of bowel control? N  Managing your Medications? N  Managing your Finances? N  Housekeeping or managing your Housekeeping? N    Patient Care Team: Hoyt Koch, MD as PCP - General (Internal Medicine) Janina Mayo, MD as PCP - Cardiology (Cardiology) Irene Shipper, MD as Consulting Physician (Gastroenterology) Szabat, Darnelle Maffucci, [facility] (Inactive) as Pharmacist (Pharmacist)  Indicate any recent Medical Services you may have received from other than [facility] providers in the past year (date may be approximate).     Assessment:   This is a routine wellness examination for Frances [name].  Hearing/Vision screen Hearing Screening - Comments:: Denies hearing difficulties  Vision Screening - Comments:: Wears rx glasses - up to date with routine eye exams with Dr. Dorcas Carrow   Dietary issues and exercise activities discussed: Current Exercise Habits: The patient does not participate in  regular exercise at present   Goals Addressed             This Visit's Progress    COMPLETED: Manage Diet-Urinary Incontinence       Timeframe:  Long-Range Goal Priority:  Medium Start Date:  06/09/2021                           Expected End Date: 12/09/2021                      Follow Up Date 08/08/2021   - cut down caffeine use - decrease fizzy drinks (like soda pop) to one a day - drink 6 to 8 glasses of water each day - stop or limit fluids 2 hours before bedtime    Why is this important?   Some foods and drinks may cause your bladder to be "irritable".  Try to stop or cut back on these things and see if your symptoms improve.  Trying not to drink very much for 2 or 3 hours before bed may help too.        COMPLETED: Manage My Medicine       Timeframe:  Long-Range Goal Priority:  High Start Date:  06/09/2021                          Expected End Date:  12/09/2021                     Follow Up Date 8/20/202    - call for medicine refill 2 or 3 days before it runs out - keep a list of all the medicines I take; vitamins and herbals too - learn to read medicine labels - use a pillbox to sort medicine - use an alarm clock or phone to remind me to take my medicine    Why is this important?   These steps will  help you keep on track with your medicines.        Depression Screen    03/15/2023    7:52 PM 07/27/2022   11:40 AM 03/09/2022    9:42 AM 01/30/2021    9:36 AM 10/31/2019   10:31 AM 09/25/2018    9:42 AM 12/17/2015    8:18 AM  PHQ 2/9 Scores  PHQ - 2 Score 0 2 1 6 2 1  0  PHQ- 9 Score  10  20 5       Fall Risk    03/15/2023    7:52 PM 03/09/2022    9:42 AM 01/30/2021    9:37 AM 10/31/2019   10:31 AM 09/25/2018    9:42 AM  Fall Risk   Falls in the past year? 0 1 0 0 Yes  Number falls in past yr: 0 1 0 0 1  Injury with Fall? 0 0 0 0 No  Risk for fall due to : No Fall Risks      Follow up Falls prevention discussed;Education provided;Falls evaluation completed   Falls prevention discussed     FALL RISK PREVENTION PERTAINING TO THE HOME:  Any stairs in or around the home? No  If so, are there any without handrails? No  Home free of loose throw rugs in walkways, pet beds, electrical cords, etc? Yes  Adequate lighting in your home to reduce risk of falls? Yes   ASSISTIVE DEVICES UTILIZED TO PREVENT FALLS:  Life alert? No  Use of a cane, walker or w/c? Yes  Grab bars in the bathroom? Yes  Shower chair or bench in shower? No  Elevated toilet seat or a handicapped toilet? Yes   TIMED UP AND GO:  Was the test performed? No . Telephonic visit   Cognitive Function:        03/15/2023    7:56 PM  6CIT Screen  What Year? 0 points  What month? 0 points  What time? 0 points  Count back from 20 0 points  Months in reverse 0 points  Repeat phrase 0 points  Total Score 0 points    Immunizations Immunization History  Administered Date(s) Administered   Fluad Quad(high Dose 65+) 09/13/2022   Influenza, High Dose Seasonal PF 10/27/2016, 09/21/2017, 09/19/2018, 09/17/2019, 09/17/2019, 10/03/2020, 09/11/2021   Influenza,inj,Quad PF,6+ Mos 12/17/2015   Influenza-Unspecified 09/19/2018, 09/17/2019   PFIZER(Purple Top)SARS-COV-2 Vaccination 01/08/2020, 01/29/2020   Pneumococcal  Conjugate-13 12/17/2015   Pneumococcal Polysaccharide-23 09/21/2017   Tdap 03/20/2010, 09/25/2018    TDAP status: Up to date  Flu Vaccine status: Up to date  Pneumococcal vaccine status: Up to date  Covid-19 vaccine status: Information provided on how to obtain vaccines.   Qualifies for Shingles Vaccine? Yes   Zostavax completed No   Shingrix Completed?: No.    Education has been provided regarding the importance of this vaccine. Patient has been advised to call insurance company to determine out of pocket expense if they have not yet received this vaccine. Advised may also receive vaccine at local pharmacy or Health Dept. Verbalized acceptance and understanding.  Screening Tests Health Maintenance  Topic Date Due   MAMMOGRAM  Never done   Zoster Vaccines- Shingrix (1 of 2) Never done   DEXA SCAN  Never done   COVID-19 Vaccine (3 - 2023-24 season) 08/20/2022   Medicare Annual Wellness (AWV)  03/14/2024   DTaP/Tdap/Td (3 - Td or Tdap) 09/25/2028   COLONOSCOPY (Pts 45-81yrs Insurance coverage will need to be confirmed)  02/01/2029   Pneumonia Vaccine 57+ Years old  Completed   INFLUENZA VACCINE  Completed   Hepatitis C Screening  Completed   HPV VACCINES  Aged Out    Health Maintenance  Health Maintenance Due  Topic Date Due   MAMMOGRAM  Never done   Zoster Vaccines- Shingrix (1 of 2) Never done   DEXA SCAN  Never done   COVID-19 Vaccine (3 - 2023-24 season) 08/20/2022    Colorectal cancer screening: Type of screening: Colonoscopy. Completed /13/20. Repeat every 10 years  Mammogram status: Declines at this time   Bone density status: Declines at this time   Lung Cancer Screening: (Low Dose CT Chest recommended if Age 61-80 years, 30 pack-year currently smoking OR have quit w/in 15years.) does not qualify.   Lung Cancer Screening Referral: n/a  Additional Screening:  Hepatitis C Screening: does qualify; Completed 09/25/18  Vision Screening: Recommended annual  ophthalmology exams for early detection of glaucoma and other disorders of the eye. Is the patient up to date with their annual eye exam?  Yes  Who is the provider or what is the name of the office in which the patient attends annual eye exams? Dr. Dorcas Carrow If pt is not established with a provider, would they like to be referred to a provider to establish care? No .   Dental Screening: Recommended annual dental exams for proper oral hygiene  Community Resource Referral / Chronic Care Management: CRR required this visit?  No   CCM required this  visit?  No      Plan:     I have personally reviewed and noted the following in the patient's chart:   Medical and social history Use of alcohol, tobacco or illicit drugs  Current medications and supplements including opioid prescriptions. Patient is not currently taking opioid prescriptions. Functional ability and status Nutritional status Physical activity Advanced directives List of other physicians Hospitalizations, surgeries, and ER visits in previous 12 months Vitals Screenings to include cognitive, depression, and falls Referrals and appointments  In addition, I have reviewed and discussed with patient certain preventive protocols, quality metrics, and best practice recommendations. A written personalized care plan for preventive services as well as general preventive health recommendations were provided to patient.     Vanetta Mulders, [place]   [id number]   Due to this being a virtual visit, the after visit summary with patients personalized plan was offered to patient via mail or my-chart. Patient would like to access on my-chart/  Nurse Notes: No concerns

## 2023-04-19 ENCOUNTER — Ambulatory Visit: Payer: Medicare HMO | Attending: Internal Medicine | Admitting: Internal Medicine

## 2023-04-19 ENCOUNTER — Encounter: Payer: Self-pay | Admitting: Internal Medicine

## 2023-04-19 VITALS — BP 124/76 | HR 53 | Ht 63.0 in | Wt 207.2 lb

## 2023-04-19 DIAGNOSIS — I480 Paroxysmal atrial fibrillation: Secondary | ICD-10-CM

## 2023-04-19 NOTE — Progress Notes (Signed)
Cardiology Office Note:    Date:  04/19/2023   ID:  Frances [name], DOB 10/27/1946, MRN [id number]  PCP:  Myrlene Broker, MD   [facility] Providers Cardiologist:  Maisie Fus, MD     Referring MD: Myrlene Broker, *   No chief complaint on file. Dizziness/Pre-syncopal  History of Present Illness:    Frances [name] is a 77 y.o. female with a hx of anxiety, GERD, noted thyroid dx with normal TSH, had a near syncopal episode and cardiology referral was sent by her PCP.  EKG in clinic noted to be sinus bradycardia with PACs.  She notes that last Friday that she had a late breakfast. She felt fluttering while eating. She then felt presyncopal. She was left jittery. She noted a hx of bigeminy in 2006. She was started taking metoprolol XL 100 mg daily.  No syncopal events. She notes dizzy spells for years She has palpitations at night. She was scheduled for a brain MRI. She's been under a lot of stress with a grandson with autism.  She is retired. She lives with her daughter. She denies CP. No progressive dyspnea  Family Hx: GM had a stroke. Father passed from a stroke. Mother had HTN.  Interim Hx 09/15/2022: She comes in today for the last 2-3 days she noted palpitations.  Zio patch ordered prior, she is wearing it. Her echo was normal. TSH nl. She's been under stress and has chest tightness.  No orthopnea/PND, no significant LE edema.  Interim hx 04/19/2023: Ordered a coronary CTA based on her symptoms prior, she declined.  No persistent chest pressure. No progressive SOB.   Past Medical History:  Diagnosis Date   Anxiety    Arthritis    Blood in stool    Depression    GERD (gastroesophageal reflux disease)    Hypertension    Sleep apnea    Thyroid disease    UTI (urinary tract infection)     Past Surgical History:  Procedure Laterality Date   HERNIA REPAIR     umbilical repair    Current Medications: Current Outpatient Medications on File Prior  to Visit  Medication Sig Dispense Refill   amLODipine (NORVASC) 5 MG tablet TAKE 1 TABLET (5 MG TOTAL) BY MOUTH DAILY. 90 tablet 3   levothyroxine (SYNTHROID) 50 MCG tablet TAKE 1 TABLET BY MOUTH DAILY BEFORE BREAKFAST 90 tablet 3   lisinopril (ZESTRIL) 40 MG tablet Take 1 tablet (40 mg total) by mouth daily. 90 tablet 3   metoprolol succinate (TOPROL-XL) 100 MG 24 hr tablet TAKE 1 TABLET BY MOUTH TWICE A DAY 180 tablet 3   polyethylene glycol (MIRALAX / GLYCOLAX) packet Take 17 g by mouth as needed. PRN     apixaban (ELIQUIS) 5 MG TABS tablet Take 1 tablet (5 mg total) by mouth 2 (two) times daily. (Patient not taking: Reported on 03/15/2023) 180 tablet 0   No current facility-administered medications on file prior to visit.     Allergies:   Patient has no known allergies.   Social History   Socioeconomic History   Marital status: Divorced    Spouse name: Not on file   Number of children: 1   Years of education: Not on file   Highest education level: Not on file  Occupational History   Occupation: retired  Tobacco Use   Smoking status: Never   Smokeless tobacco: Never  Vaping Use   Vaping Use: Never used  Substance and Sexual Activity  Alcohol use: No    Alcohol/week: 0.0 standard drinks of alcohol   Drug use: No   Sexual activity: Not Currently  Other Topics Concern   Not on file  Social History Narrative   Not on file   Social Determinants of Health   Financial Resource Strain: Low Risk  (03/15/2023)   Overall Financial Resource Strain (CARDIA)    Difficulty of Paying Living Expenses: Not hard at all  Food Insecurity: No Food Insecurity (03/15/2023)   Hunger Vital Sign    Worried About Running Out of Food in the Last Year: Never true    Ran Out of Food in the Last Year: Never true  Transportation Needs: No Transportation Needs (03/15/2023)   PRAPARE - Administrator, Civil Service (Medical): No    Lack of Transportation (Non-Medical): No  Physical  Activity: Inactive (03/15/2023)   Exercise Vital Sign    Days of Exercise per Week: 0 days    Minutes of Exercise per Session: 0 min  Stress: No Stress Concern Present (03/15/2023)   [employer] of Occupational Health - Occupational Stress Questionnaire    Feeling of Stress : Not at all  Social Connections: Moderately Isolated (03/15/2023)   Social Connection and Isolation Panel [NHANES]    Frequency of Communication with Friends and Family: Three times a week    Frequency of Social Gatherings with Friends and Family: Three times a week    Attends Religious Services: 1 to 4 times per year    Active Member of Clubs or Organizations: No    Attends Engineer, structural: Never    Marital Status: Divorced     Family History: The patient's family history includes Alcohol abuse in her father; Arthritis in her father, maternal grandfather, maternal grandmother, mother, paternal grandfather, and paternal grandmother; Stroke in her father and maternal grandmother. There is no history of Colon cancer, Esophageal cancer, Rectal cancer, or Stomach cancer.  ROS:   Please see the history of present illness.     All other systems reviewed and are negative.  EKGs/Labs/Other Studies Reviewed:    The following studies were reviewed today:   EKG:  EKG is  ordered today.  The ekg ordered today demonstrates   07/28/2022-Sinus brady with PACs  TTE 08/02/2022 1. Left ventricular ejection fraction, by estimation, is 60 to 65%. The  left ventricle has normal function. The left ventricle has no regional  wall motion abnormalities. There is mild concentric left ventricular  hypertrophy. Left ventricular diastolic  parameters are indeterminate.   2. Right ventricular systolic function is normal. The right ventricular  size is normal.   3. The mitral valve is normal in structure. No evidence of mitral valve  regurgitation. No evidence of mitral stenosis.   4. Tricuspid valve regurgitation is  mild to moderate.   5. The aortic valve is tricuspid. Aortic valve regurgitation is not  visualized. No aortic stenosis is present.   6. The inferior vena cava is normal in size with greater than 50%  respiratory variability, suggesting right atrial pressure of 3 mmHg.    Cardiac Monitor 10/06/2022 3% afib/flutter burden.   The 10-year ASCVD risk score (Arnett DK, et al., 2019) is: 10.5%   Values used to calculate the score:     Age: 58 years     Sex: Female     Is Non-Hispanic African American: Yes     Diabetic: No     Tobacco smoker: No     Systolic  Blood Pressure: 124 mmHg     Is BP treated: Yes     HDL Cholesterol: 43.3 mg/dL     Total Cholesterol: 158 mg/dL   Recent Labs: 12/25/1094: ALT 11; Hemoglobin 13.6; Platelets 285.0; TSH 1.82 09/15/2022: BUN 13; Creatinine, Ser 1.20; Potassium 4.4; Sodium 146   Recent Lipid Panel    Component Value Date/Time   CHOL 158 03/09/2022 1014   TRIG 97.0 03/09/2022 1014   HDL 43.30 03/09/2022 1014   CHOLHDL 4 03/09/2022 1014   VLDL 19.4 03/09/2022 1014   LDLCALC 95 03/09/2022 1014     Risk Assessment/Calculations:           Physical Exam:    VS:  Vitals:   04/19/23 1001  BP: 124/76  Pulse: (!) 53  SpO2: 97%     Wt Readings from Last 3 Encounters:  03/15/23 206 lb (93.4 kg)  09/15/22 206 lb (93.4 kg)  07/28/22 208 lb 9.6 oz (94.6 kg)     GEN:  Well nourished, well developed in no acute distress HEENT: Normal NECK: No JVD; No carotid bruits LYMPHATICS: No lymphadenopathy CARDIAC: RRR, no murmurs, rubs, gallops RESPIRATORY:  Clear to auscultation without rales, wheezing or rhonchi  ABDOMEN: Soft, non-tender, non-distended MUSCULOSKELETAL:  No edema; No deformity  SKIN: Warm and dry NEUROLOGIC:  Alert and oriented x 3 PSYCHIATRIC:  Normal affect   ASSESSMENT:    Paroxysmal Atrial Fibrillation: Found on cardiac monitor fall 2023. nl TSH.  - continue metop XL 100 mg BID - on hold eliquis 5 mg BID 2/2 dental  work; she plans to restart ; is a cost issue, working on this  HTN: well controlled. on norvasc 5 mg daily and lisinopril 40 mg daily  Elevated ASCVD: discussed crestor 5 mg daily, will re-assess risk and may reconsider  PLAN:    In order of problems listed above:    Follow up 6 months         Medication Adjustments/Labs and Tests Ordered: Current medicines are reviewed at length with the patient today.  Concerns regarding medicines are outlined above.  No orders of the defined types were placed in this encounter.  No orders of the defined types were placed in this encounter.   There are no Patient Instructions on file for this visit.   Signed, Maisie Fus, MD  04/19/2023 8:01 AM    [facility]

## 2023-04-19 NOTE — Patient Instructions (Signed)
Medication Instructions:   Your physician recommends that you continue on your current medications as directed. Please refer to the Current Medication list given to you today.  *If you need a refill on your cardiac medications before your next appointment, please call your pharmacy*  Lab Work: NONE ordered at this time of appointment   If you have labs (blood work) drawn today and your tests are completely normal, you will receive your results only by: MyChart Message (if you have MyChart) OR A paper copy in the mail If you have any lab test that is abnormal or we need to change your treatment, we will call you to review the results.  Testing/Procedures: NONE ordered at this time of appointment   Follow-Up: At [facility], you and your health needs are our priority.  As part of our continuing mission to provide you with exceptional heart care, we have created designated Provider Care Teams.  These Care Teams include your primary Cardiologist (physician) and Advanced Practice Providers (APPs -  Physician Assistants and Nurse Practitioners) who all work together to provide you with the care you need, when you need it.    Your next appointment:   6 month(s)  Provider:   Branch, Mary E, MD     Other Instructions   

## 2023-04-28 ENCOUNTER — Encounter (HOSPITAL_COMMUNITY): Payer: Self-pay

## 2023-05-04 ENCOUNTER — Telehealth: Payer: Self-pay | Admitting: Internal Medicine

## 2023-05-04 DIAGNOSIS — I480 Paroxysmal atrial fibrillation: Secondary | ICD-10-CM

## 2023-05-04 NOTE — Telephone Encounter (Signed)
Spoke to patient she wanted to ask Dr.Branch if she needed to have a coronary ct.Stated she mentioned having one done at last visit.  Stated she recently had 16 teeth pulled and has not started on Eliquis.Stated her gums have healed.Advised she needs to start taking Eliquis as prescribed.She is concerned about the cost.Stated she did not qualify for patient assistance.I will send message to Dr.Branch for advice if she recommends a coronary ct and if she recommends Warfarin.

## 2023-05-04 NOTE — Telephone Encounter (Signed)
Patient is calling to clarification as to if she needs to have the CT currently or if this was something Dr. Wyline Mood still wanted to wait for. Please advise.

## 2023-05-05 MED ORDER — APIXABAN 5 MG PO TABS: 5.0000 mg | ORAL_TABLET | Freq: Two times a day (BID) | ORAL | 6 refills | Status: DC

## 2023-05-05 NOTE — Telephone Encounter (Signed)
Spoke to patient Dr.Branch advised you do not need a coronary ct at this time.She sent message to pharmacy team and Eliquis is the better price on your insurance plan.Advised to start taking Eliquis 5 mg twice a day.30 day prescription sent to your pharmacy.Advised to keep appointment already scheduled with Dr.Branch 10/21/23 at 10:00 am.Advised to call sooner if needed.

## 2023-05-05 NOTE — Telephone Encounter (Signed)
Called patient left message on personal voice mail to cal back.

## 2023-05-05 NOTE — Telephone Encounter (Signed)
Eliquis should have a $47/month copay with her [employer] part D plan ($141 for a 3 month supply). There is not a way to lower this rate unfortunately. Pradaxa has a generic but using Goodrx coupon, cost is ~$70/month. Xarelto has a Medicare program for pts in the donut hole but their copay is $89/month so her rate through her insurance is the cheapest. If this is unaffordable, warfarin would be the other option but would require frequent in-office lab monitoring.

## 2023-06-01 ENCOUNTER — Other Ambulatory Visit: Payer: Self-pay | Admitting: Internal Medicine

## 2023-06-01 DIAGNOSIS — I10 Essential (primary) hypertension: Secondary | ICD-10-CM

## 2023-06-21 DIAGNOSIS — Z79899 Other long term (current) drug therapy: Secondary | ICD-10-CM | POA: Diagnosis not present

## 2023-06-28 DIAGNOSIS — Z1211 Encounter for screening for malignant neoplasm of colon: Secondary | ICD-10-CM | POA: Diagnosis not present

## 2023-06-28 DIAGNOSIS — Z79899 Other long term (current) drug therapy: Secondary | ICD-10-CM | POA: Diagnosis not present

## 2023-07-19 ENCOUNTER — Ambulatory Visit: Payer: Medicare HMO | Attending: Family

## 2023-07-19 ENCOUNTER — Other Ambulatory Visit: Payer: Self-pay | Admitting: *Deleted

## 2023-07-19 DIAGNOSIS — Z13820 Encounter for screening for osteoporosis: Secondary | ICD-10-CM | POA: Diagnosis not present

## 2023-07-19 DIAGNOSIS — I4891 Unspecified atrial fibrillation: Secondary | ICD-10-CM

## 2023-07-19 DIAGNOSIS — R002 Palpitations: Secondary | ICD-10-CM

## 2023-07-19 DIAGNOSIS — R42 Dizziness and giddiness: Secondary | ICD-10-CM

## 2023-07-19 NOTE — Progress Notes (Unsigned)
Enrolled for [employer] to mail a ZIO XT long term holter monitor to the patients address on file.   Dr. Mary Branch to read. 

## 2023-07-30 ENCOUNTER — Other Ambulatory Visit: Payer: Self-pay | Admitting: Internal Medicine

## 2023-07-30 DIAGNOSIS — I10 Essential (primary) hypertension: Secondary | ICD-10-CM

## 2023-08-16 DIAGNOSIS — H5203 Hypermetropia, bilateral: Secondary | ICD-10-CM | POA: Diagnosis not present

## 2023-08-26 ENCOUNTER — Other Ambulatory Visit: Payer: Self-pay | Admitting: Internal Medicine

## 2023-08-26 DIAGNOSIS — I10 Essential (primary) hypertension: Secondary | ICD-10-CM

## 2023-08-29 ENCOUNTER — Other Ambulatory Visit: Payer: Self-pay | Admitting: Internal Medicine

## 2023-08-29 DIAGNOSIS — I10 Essential (primary) hypertension: Secondary | ICD-10-CM

## 2023-09-15 ENCOUNTER — Ambulatory Visit: Payer: Medicare HMO | Attending: Cardiovascular Disease | Admitting: Cardiovascular Disease

## 2023-09-15 ENCOUNTER — Encounter: Payer: Self-pay | Admitting: Cardiovascular Disease

## 2023-09-15 VITALS — BP 148/76 | HR 58 | Ht 63.0 in | Wt 204.6 lb

## 2023-09-15 DIAGNOSIS — I4891 Unspecified atrial fibrillation: Secondary | ICD-10-CM | POA: Diagnosis not present

## 2023-09-15 NOTE — Patient Instructions (Signed)
Medication Instructions:  Your physician recommends that you continue on your current medications as directed. Please refer to the Current Medication list given to you today.  *If you need a refill on your cardiac medications before your next appointment, please call your pharmacy*   Lab Work: None ordered.  If you have labs (blood work) drawn today and your tests are completely normal, you will receive your results only by: MyChart Message (if you have MyChart) OR A paper copy in the mail If you have any lab test that is abnormal or we need to change your treatment, we will call you to review the results.   Testing/Procedures: None ordered.    Follow-Up: At [facility], you and your health needs are our priority.  As part of our continuing mission to provide you with exceptional heart care, we have created designated Provider Care Teams.  These Care Teams include your primary Cardiologist (physician) and Advanced Practice Providers (APPs -  Physician Assistants and Nurse Practitioners) who all work together to provide you with the care you need, when you need it.  We recommend signing up for the patient portal called "MyChart".  Sign up information is provided on this After Visit Summary.  MyChart is used to connect with patients for Virtual Visits (Telemedicine).  Patients are able to view lab/test results, encounter notes, upcoming appointments, etc.  Non-urgent messages can be sent to your provider as well.   To learn more about what you can do with MyChart, go to [email or web address].    Your next appointment:   6 months with Dr Morrie Sheldon PA

## 2023-09-15 NOTE — Progress Notes (Signed)
Electrophysiology Office Note:    Date:  09/15/2023   ID:  Frances [name], DOB 05/20/1946, MRN [id number]  PCP:  Myrlene Broker, MD   [facility] Providers Cardiologist:  Maisie Fus, MD     Referring MD: Vladimir Crofts, FNP   History of Present Illness:    Frances [name] is a 77 y.o. female with a medical history significant for atrial fibrillation, referred for arrhythmia management.     She has a history of ectopy and has been treated with metoprolol for bigeminy in the past.  A monitor was placed in September 2023 for palpitations.  This showed approximately 7% burden of PVCs and a low burden of atrial fibrillation with poorly controlled rates. She was started on Eliquis, and her dose of metoprolol XL 100 twice daily was continued for the atrial fibrillation.  She is not particular symptomatic with atrial fibrillation and has not noticed any racing heart episodic fatigue.  Her primary concern is that her grandson, who has autism, is causing significant amount of stress in her life.  At present, she is not interested in wearing another monitor to evaluate her atrial fibrillation burden and heart rates     Today, she reports that she is at baseline.  No significant fatigue shortness of breath today.  EKGs/Labs/Other Studies Reviewed Today:     Echocardiogram:  TTE August 02, 2022 EF 60 to 65%.  Normal RV systolic function.  Normal mitral valve function.   Monitors:  Zio XT monitor September 2023  --my interpretation Sinus rhythm predominates, heart rate 44-108 beats minute, average 61 3% atrial fibrillation burden, heart rate 54-181 beats minute, average 111 7.4% PVC burden  Stress testing:   Advanced imaging:   Cardiac catherization    EKG:   EKG Interpretation Date/Time:  Thursday September 15 2023 11:30:29 EDT Ventricular Rate:  53 PR Interval:  134 QRS Duration:  90 QT Interval:  442 QTC Calculation: 414 R Axis:   41  Text  Interpretation: Sinus bradycardia Nonspecific T wave abnormality When compared with ECG of 19-Jan-2011 15:24, Premature ventricular complexes are no longer Present Vent. rate has decreased BY  60 BPM Confirmed by York Pellant [phone]) on 09/15/2023 11:35:27 AM     Physical Exam:    VS:  BP (!) 148/76   Pulse (!) 58   Ht 5\' 3"  (1.6 m)   Wt 204 lb 9.6 oz (92.8 kg)   SpO2 96%   BMI 36.24 kg/m     Wt Readings from Last 3 Encounters:  09/15/23 204 lb 9.6 oz (92.8 kg)  04/19/23 207 lb 3.2 oz (94 kg)  03/15/23 206 lb (93.4 kg)     GEN: Well nourished, well developed in no acute distress CARDIAC: RRR, no murmurs, rubs, gallops RESPIRATORY:  Normal work of breathing MUSCULOSKELETAL: no edema    ASSESSMENT & PLAN:     Paroxysmal atrial fibrillation Burden 3% in September 2023.   Heart rate control is suboptimal continue metoprolol XL 200 mg twice daily  Secondary hypercoagulable state She has been reluctant to take apixaban I reinforced to her the importance of this medication at reducing her risk of stroke She indicates that she will start taking it  Obesity BMI 39.5 She has lost a significant amount of weight, she reports I encouraged her to continue weight loss    Signed, Maurice Small, MD  09/15/2023 11:40 AM    [facility]

## 2023-10-21 ENCOUNTER — Ambulatory Visit: Payer: Medicare HMO | Admitting: Internal Medicine

## 2023-11-24 ENCOUNTER — Other Ambulatory Visit: Payer: Self-pay | Admitting: Internal Medicine

## 2023-11-24 DIAGNOSIS — I10 Essential (primary) hypertension: Secondary | ICD-10-CM

## 2024-01-12 ENCOUNTER — Encounter: Payer: Self-pay | Admitting: Internal Medicine

## 2024-01-12 ENCOUNTER — Ambulatory Visit (INDEPENDENT_AMBULATORY_CARE_PROVIDER_SITE_OTHER): Payer: Medicare HMO | Admitting: Internal Medicine

## 2024-01-12 VITALS — BP 138/88 | HR 76 | Temp 97.9°F | Ht 63.0 in | Wt 205.0 lb

## 2024-01-12 DIAGNOSIS — R739 Hyperglycemia, unspecified: Secondary | ICD-10-CM | POA: Diagnosis not present

## 2024-01-12 DIAGNOSIS — M150 Primary generalized (osteo)arthritis: Secondary | ICD-10-CM | POA: Diagnosis not present

## 2024-01-12 DIAGNOSIS — I480 Paroxysmal atrial fibrillation: Secondary | ICD-10-CM

## 2024-01-12 DIAGNOSIS — E039 Hypothyroidism, unspecified: Secondary | ICD-10-CM

## 2024-01-12 DIAGNOSIS — M542 Cervicalgia: Secondary | ICD-10-CM | POA: Diagnosis not present

## 2024-01-12 DIAGNOSIS — I10 Essential (primary) hypertension: Secondary | ICD-10-CM | POA: Diagnosis not present

## 2024-01-12 DIAGNOSIS — Z0000 Encounter for general adult medical examination without abnormal findings: Secondary | ICD-10-CM

## 2024-01-12 DIAGNOSIS — E559 Vitamin D deficiency, unspecified: Secondary | ICD-10-CM

## 2024-01-12 LAB — LIPID PANEL
Cholesterol: 173 mg/dL (ref 0–200)
HDL: 48.3 mg/dL (ref >39.00)
LDL Cholesterol: 105 mg/dL — ABNORMAL HIGH (ref 0–99)
NonHDL: 125.04
Total CHOL/HDL Ratio: 4
Triglycerides: 101 mg/dL (ref 0.0–149.0)
VLDL: 20.2 mg/dL (ref 0.0–40.0)

## 2024-01-12 LAB — COMPREHENSIVE METABOLIC PANEL
ALT: 9 U/L (ref 0–35)
AST: 13 U/L (ref 0–37)
Albumin: 4.2 g/dL (ref 3.5–5.2)
Alkaline Phosphatase: 63 U/L (ref 39–117)
BUN: 13 mg/dL (ref 6–23)
CO2: 31 meq/L (ref 19–32)
Calcium: 9.8 mg/dL (ref 8.4–10.5)
Chloride: 106 meq/L (ref 96–112)
Creatinine, Ser: 1.05 mg/dL (ref 0.40–1.20)
GFR: 52 mL/min — ABNORMAL LOW (ref >60.00)
Glucose, Bld: 83 mg/dL (ref 70–99)
Potassium: 4.3 meq/L (ref 3.5–5.1)
Sodium: 145 meq/L (ref 135–145)
Total Bilirubin: 0.5 mg/dL (ref 0.2–1.2)
Total Protein: 7.6 g/dL (ref 6.0–8.3)

## 2024-01-12 LAB — CBC
HCT: 44.6 % (ref 36.0–46.0)
Hemoglobin: 14.7 g/dL (ref 12.0–15.0)
MCHC: 33 g/dL (ref 30.0–36.0)
MCV: 88.3 fL (ref 78.0–100.0)
Platelets: 337 10*3/uL (ref 150.0–400.0)
RBC: 5.06 Mil/uL (ref 3.87–5.11)
RDW: 15 % (ref 11.5–15.5)
WBC: 6.7 10*3/uL (ref 4.0–10.5)

## 2024-01-12 LAB — VITAMIN D 25 HYDROXY (VIT D DEFICIENCY, FRACTURES): VITD: 25.65 ng/mL — ABNORMAL LOW (ref 30.00–100.00)

## 2024-01-12 LAB — HEMOGLOBIN A1C: Hgb A1c MFr Bld: 5.8 % (ref 4.6–6.5)

## 2024-01-12 LAB — TSH: TSH: 2.5 u[IU]/mL (ref 0.35–5.50)

## 2024-01-12 MED ORDER — LEVOTHYROXINE SODIUM 50 MCG PO TABS: 50.0000 ug | ORAL_TABLET | Freq: Every day | ORAL | 3 refills | Status: AC

## 2024-01-12 MED ORDER — PREDNISONE 20 MG PO TABS: 40.0000 mg | ORAL_TABLET | Freq: Every day | ORAL | 0 refills | Status: DC

## 2024-01-12 MED ORDER — AMLODIPINE BESYLATE 5 MG PO TABS: 5.0000 mg | ORAL_TABLET | Freq: Every day | ORAL | 3 refills | Status: AC

## 2024-01-12 MED ORDER — LISINOPRIL 40 MG PO TABS
40.0000 mg | ORAL_TABLET | Freq: Every day | ORAL | 0 refills | Status: DC
Start: 2024-01-12 — End: 2024-01-17

## 2024-01-12 MED ORDER — APIXABAN 5 MG PO TABS: 5.0000 mg | ORAL_TABLET | Freq: Two times a day (BID) | ORAL | 3 refills | Status: DC

## 2024-01-12 MED ORDER — METOPROLOL SUCCINATE ER 100 MG PO TB24: 100.0000 mg | ORAL_TABLET | Freq: Two times a day (BID) | ORAL | 3 refills | Status: AC

## 2024-01-12 NOTE — Progress Notes (Signed)
   Subjective:   Patient ID: Frances [name], female    DOB: 1946/10/10, 78 y.o.   MRN: [id number]  HPI The patient is a 78 YO female coming in for neck pain for about 1 week. Last visit 2023. Also desires physical.  PMH, FMH, social history reviewed and update  Review of Systems  Constitutional: Negative.   HENT: Negative.    Eyes: Negative.   Respiratory:  Negative for cough, chest tightness and shortness of breath.   Cardiovascular:  Negative for chest pain, palpitations and leg swelling.  Gastrointestinal:  Negative for abdominal distention, abdominal pain, constipation, diarrhea, nausea and vomiting.  Musculoskeletal:  Positive for arthralgias and neck pain. Negative for neck stiffness.  Skin: Negative.   Neurological: Negative.   Psychiatric/Behavioral: Negative.      Objective:  Physical Exam Constitutional:      Appearance: She is well-developed.  HENT:     Head: Normocephalic and atraumatic.  Cardiovascular:     Rate and Rhythm: Normal rate and regular rhythm.  Pulmonary:     Effort: Pulmonary effort is normal. No respiratory distress.     Breath sounds: Normal breath sounds. No wheezing or rales.  Abdominal:     General: Bowel sounds are normal. There is no distension.     Palpations: Abdomen is soft.     Tenderness: There is no abdominal tenderness. There is no rebound.  Musculoskeletal:        General: Tenderness present.     Cervical back: Normal range of motion.  Skin:    General: Skin is warm and dry.  Neurological:     Mental Status: She is alert and oriented to person, place, and time.     Coordination: Coordination normal.    Vitals:   01/12/24 1040 01/12/24 1126  BP: (!) 160/80 138/88  Pulse: 76   Temp: 97.9 F (36.6 C)   TempSrc: Oral   SpO2: 96%   Weight: 205 lb (93 kg)   Height: 5\' 3"  (1.6 m)     Assessment & Plan:

## 2024-01-12 NOTE — Patient Instructions (Addendum)
Go ahead and start taking eliquis for reducing stroke.  We will do prednisone to help with the neck pain. Take 2 pills daily for 5 days.  Turmeric is helpful for arthritis pain.

## 2024-01-13 ENCOUNTER — Encounter: Payer: Self-pay | Admitting: Internal Medicine

## 2024-01-13 DIAGNOSIS — M542 Cervicalgia: Secondary | ICD-10-CM | POA: Insufficient documentation

## 2024-01-13 DIAGNOSIS — I480 Paroxysmal atrial fibrillation: Secondary | ICD-10-CM | POA: Insufficient documentation

## 2024-01-13 NOTE — Assessment & Plan Note (Signed)
Checking TSH and adjust levothyroxine 50 mcg daily as needed.

## 2024-01-13 NOTE — Assessment & Plan Note (Signed)
BP at goal on amlodipine 5 mg daily and lisinopril 40 mg daily and metoprolol 100 mg BID and checking CMP and adjust as needed.

## 2024-01-13 NOTE — Assessment & Plan Note (Addendum)
She has been unwilling to start eliquis due to cost. She is counseled about the risk of stroke and mechanism of risk of stroke in A fib. She is willing to consider to try starting but worries about affordability ongoing.

## 2024-01-13 NOTE — Assessment & Plan Note (Signed)
Checking HgA1c and adjust as needed.

## 2024-01-13 NOTE — Assessment & Plan Note (Signed)
Started about 1 week ago and rx prednisone short course to help. No imaging indicated today.

## 2024-01-13 NOTE — Assessment & Plan Note (Signed)
She does suffer with arthritis in many joints and is advised to only use tylenol since she will be starting eliquis.

## 2024-01-13 NOTE — Assessment & Plan Note (Signed)
Checking vitamin D and adjust as needed.

## 2024-01-13 NOTE — Assessment & Plan Note (Signed)
Flu shot up to date. Pneumonia complete. Shingrix due at pharmacy. Tetanus up to date. Colonoscopy counseled. Mammogram counseled, pap smear aged out and dexa up to date. Counseled about sun safety and mole surveillance. Counseled about the dangers of distracted driving. Given 10 year screening recommendations.

## 2024-01-16 ENCOUNTER — Encounter: Payer: Self-pay | Admitting: Internal Medicine

## 2024-01-17 ENCOUNTER — Other Ambulatory Visit: Payer: Self-pay | Admitting: Internal Medicine

## 2024-01-17 DIAGNOSIS — I10 Essential (primary) hypertension: Secondary | ICD-10-CM

## 2024-01-17 NOTE — Telephone Encounter (Signed)
Copied from CRM [id number]. Topic: Clinical - Medication Refill >> Jan 17, 2024  1:10 PM Alvino Blood C wrote: Most Recent Primary Care Visit:  Provider: Hillard Danker A  Department: [facility]  Visit Type: ACUTE  Date: 01/12/2024  Medication: lisinopril (ZESTRIL) 40 MG tablet  Has the patient contacted their pharmacy? Yes (Agent: If no, request that the patient contact the pharmacy for the refill. If patient does not wish to contact the pharmacy document the reason why and proceed with request.) (Agent: If yes, when and what did the pharmacy advise?)  Is this the correct pharmacy for this prescription? Yes If no, delete pharmacy and type the correct one.  This is the patient's preferred pharmacy:  [facility] [address], [place] - [address] [place] [address] Phone: [phone] Fax: [phone]   Has the prescription been filled recently? Yes  Is the patient out of the medication? Yes  Has the patient been seen for an appointment in the last year OR does the patient have an upcoming appointment? Yes  Can we respond through MyChart? No  Agent: Please be advised that Rx refills may take up to 3 business days. We ask that you follow-up with your pharmacy.

## 2024-01-18 ENCOUNTER — Other Ambulatory Visit: Payer: Self-pay

## 2024-01-18 MED ORDER — LISINOPRIL 40 MG PO TABS: 40.0000 mg | ORAL_TABLET | Freq: Every day | ORAL | 0 refills | Status: DC

## 2024-03-21 ENCOUNTER — Ambulatory Visit: Payer: Self-pay

## 2024-03-21 NOTE — Telephone Encounter (Signed)
  Chief Complaint: dizziness Symptoms: lightheadedness and dizziness Frequency: states she has been having bad dizzy spells for the last couple of weeks Pertinent Negatives: Patient denies fever, CP, SOB Disposition: [] ED /[] Urgent Frances [name] (no appt availability in office) / [x] Appointment(In office/virtual)/ []  Frances Frances [name]/ [] Home Frances [name]/ [] Refused Recommended Disposition /[] Frances Frances [name]/ []  Follow-up with PCP Additional Notes: patient called to report bad dizzy spells for the last couple of weeks. Patient states she has had dizziness before but these spells seems to be worse. Patient does have a history of a fib. Patient states currently dizziness is mild but dizziness has been worse at times. Patient is requesting to be seen in the office. Per protocol, patient is recommended to be seen within 24 hours. Appointment made for tomorrow, 03/22/2024 at 9:40 AM with another provider in office. Patient verbalized understanding of plan and discussed hydration status. All questions answered.    Copied from CRM [id number]. Topic: Clinical - Red Word Triage >> Mar 21, 2024  1:17 PM Frances Frances [name] wrote: Red Word that prompted transfer to Nurse Triage: Really bad dizzy spells Reason for Disposition  [1] MODERATE dizziness (e.g., interferes with normal activities) AND [2] has NOT been evaluated by doctor (or NP/PA) for this  (Exception: Dizziness caused by heat exposure, sudden standing, or poor fluid intake.)  Answer Assessment - Initial Assessment Questions 1. DESCRIPTION: "Describe your dizziness."     Feels like she is going to lose her balance and that the room is spinning 2. LIGHTHEADED: "Do you feel lightheaded?" (e.g., somewhat faint, woozy, weak upon standing)     yes 3. VERTIGO: "Do you feel like either you or the room is spinning or tilting?" (i.e. vertigo)     yes 4. SEVERITY: "How bad is it?"  "Do you feel like you are going to faint?" "Can you stand and walk?"   - MILD: Feels slightly  dizzy, but walking normally.   - MODERATE: Feels unsteady when walking, but not falling; interferes with normal activities (e.g., school, work).   - SEVERE: Unable to walk without falling, or requires assistance to walk without falling; feels like passing out now.      Mild 5. ONSET:  "When did the dizziness begin?"     Dizziness has been going on for awhile  6. AGGRAVATING FACTORS: "Does anything make it worse?" (e.g., standing, change in head position)     Change in position-feels it worse in the morning 7. HEART RATE: "Can you tell me your heart rate?" "How many beats in 15 seconds?"  (Note: not all patients can do this)       Not able to tell HR 8. CAUSE: "What do you think is causing the dizziness?"     unsure 9. RECURRENT SYMPTOM: "Have you had dizziness before?" If Yes, ask: "When was the last time?" "What happened that time?"     Yes-can been having dizzy spells for the last couple of weeks 10. OTHER SYMPTOMS: "Do you have any other symptoms?" (e.g., fever, chest pain, vomiting, diarrhea, bleeding)       Had some tightness in chest about a week ago but patient states she was under a lot of stress at the time  Protocols used: Dizziness - Lightheadedness-A-AH

## 2024-03-22 ENCOUNTER — Ambulatory Visit (INDEPENDENT_AMBULATORY_CARE_PROVIDER_SITE_OTHER): Admitting: Emergency Medicine

## 2024-03-22 ENCOUNTER — Encounter: Payer: Self-pay | Admitting: Emergency Medicine

## 2024-03-22 VITALS — BP 142/90 | HR 58 | Temp 98.3°F | Ht 63.0 in | Wt 201.0 lb

## 2024-03-22 DIAGNOSIS — I480 Paroxysmal atrial fibrillation: Secondary | ICD-10-CM | POA: Diagnosis not present

## 2024-03-22 DIAGNOSIS — I10 Essential (primary) hypertension: Secondary | ICD-10-CM | POA: Diagnosis not present

## 2024-03-22 DIAGNOSIS — B372 Candidiasis of skin and nail: Secondary | ICD-10-CM | POA: Diagnosis not present

## 2024-03-22 DIAGNOSIS — R42 Dizziness and giddiness: Secondary | ICD-10-CM

## 2024-03-22 LAB — COMPREHENSIVE METABOLIC PANEL WITH GFR
ALT: 10 U/L (ref 0–35)
AST: 13 U/L (ref 0–37)
Albumin: 3.8 g/dL (ref 3.5–5.2)
Alkaline Phosphatase: 58 U/L (ref 39–117)
BUN: 15 mg/dL (ref 6–23)
CO2: 31 meq/L (ref 19–32)
Calcium: 9.3 mg/dL (ref 8.4–10.5)
Chloride: 105 meq/L (ref 96–112)
Creatinine, Ser: 1.12 mg/dL (ref 0.40–1.20)
GFR: 48.06 mL/min — ABNORMAL LOW (ref >60.00)
Glucose, Bld: 88 mg/dL (ref 70–99)
Potassium: 4.2 meq/L (ref 3.5–5.1)
Sodium: 142 meq/L (ref 135–145)
Total Bilirubin: 0.5 mg/dL (ref 0.2–1.2)
Total Protein: 6.8 g/dL (ref 6.0–8.3)

## 2024-03-22 LAB — CBC WITH DIFFERENTIAL/PLATELET
Basophils Absolute: 0 10*3/uL (ref 0.0–0.1)
Basophils Relative: 0.6 % (ref 0.0–3.0)
Eosinophils Absolute: 0.2 10*3/uL (ref 0.0–0.7)
Eosinophils Relative: 2.4 % (ref 0.0–5.0)
HCT: 40.8 % (ref 36.0–46.0)
Hemoglobin: 13.5 g/dL (ref 12.0–15.0)
Lymphocytes Relative: 22.5 % (ref 12.0–46.0)
Lymphs Abs: 1.6 10*3/uL (ref 0.7–4.0)
MCHC: 33.1 g/dL (ref 30.0–36.0)
MCV: 86.6 fl (ref 78.0–100.0)
Monocytes Absolute: 0.5 10*3/uL (ref 0.1–1.0)
Monocytes Relative: 7.2 % (ref 3.0–12.0)
Neutro Abs: 4.8 10*3/uL (ref 1.4–7.7)
Neutrophils Relative %: 67.3 % (ref 43.0–77.0)
Platelets: 314 10*3/uL (ref 150.0–400.0)
RBC: 4.72 Mil/uL (ref 3.87–5.11)
RDW: 14.7 % (ref 11.5–15.5)
WBC: 7.1 10*3/uL (ref 4.0–10.5)

## 2024-03-22 LAB — VITAMIN B12: Vitamin B-12: 183 pg/mL — ABNORMAL LOW (ref 211–911)

## 2024-03-22 LAB — TSH: TSH: 1.88 u[IU]/mL (ref 0.35–5.50)

## 2024-03-22 LAB — VITAMIN D 25 HYDROXY (VIT D DEFICIENCY, FRACTURES): VITD: 21.02 ng/mL — ABNORMAL LOW (ref 30.00–100.00)

## 2024-03-22 MED ORDER — NYSTATIN 100000 UNIT/GM EX CREA: 1.0000 | TOPICAL_CREAM | Freq: Two times a day (BID) | CUTANEOUS | 0 refills | Status: DC

## 2024-03-22 NOTE — Assessment & Plan Note (Signed)
 She has been unwilling to start eliquis due to cost. She is counseled about the risk of stroke and mechanism of risk of stroke in A fib. She is willing to consider to try starting but worries about affordability ongoing.

## 2024-03-22 NOTE — Progress Notes (Signed)
 Frances [name] 78 y.o.   Chief Complaint  Patient presents with   Dizziness    Patient states she's been having dizzy spells for years but here recently they have been intense. Worse in the morning, she doesn't have them everyday its on and off. Patient mentions she was dx with afib but never started eliquis, she wanted a second on having Afib because the medication is expensive. Pt also mentions she's been itchy all over for awhile especially under her stomach because she lost so much weight and a cream.     HISTORY OF PRESENT ILLNESS: This is a 78 y.o. female complaining of chronic dizzy spells getting worse and more intense over the last 2 weeks. Denies syncopal episodes.  At times feels her balance is off.  No other associated symptoms. History of paroxysmal A-fib.  Not taking any blood thinners. Also complaining of itching to lower abdominal area.  HPI   Prior to Admission medications   Medication Sig Start Date End Date Taking? Authorizing Provider  amLODipine (NORVASC) 5 MG tablet Take 1 tablet (5 mg total) by mouth daily. 01/12/24  Yes Myrlene Broker, MD  levothyroxine (SYNTHROID) 50 MCG tablet Take 1 tablet (50 mcg total) by mouth daily before breakfast. 01/12/24  Yes Myrlene Broker, MD  lisinopril (ZESTRIL) 40 MG tablet Take 1 tablet (40 mg total) by mouth daily. 01/18/24  Yes Myrlene Broker, MD  metoprolol succinate (TOPROL-XL) 100 MG 24 hr tablet Take 1 tablet (100 mg total) by mouth 2 (two) times daily. 01/12/24  Yes Myrlene Broker, MD  apixaban (ELIQUIS) 5 MG TABS tablet Take 1 tablet (5 mg total) by mouth 2 (two) times daily. Patient not taking: Reported on 03/22/2024 01/12/24   Myrlene Broker, MD  polyethylene glycol [facility] / Ethelene Hal) packet Take 17 g by mouth as needed. PRN Patient not taking: Reported on 03/22/2024    [provider]  predniSONE (DELTASONE) 20 MG tablet Take 2 tablets (40 mg total) by mouth daily with  breakfast. Patient not taking: Reported on 03/22/2024 01/12/24   Myrlene Broker, MD    No Known Allergies  Patient Active Problem List   Diagnosis Date Noted   Paroxysmal atrial fibrillation (HCC) 01/13/2024   New onset of headaches after age 77 07/27/2022   Primary osteoarthritis involving multiple joints 06/08/2022   Gait disorder 06/08/2022   Urge incontinence 06/24/2020   Candidal skin infection 06/24/2020   Adjustment disorder 06/24/2020   Palpitations 02/28/2018   Hyperglycemia 06/30/2017   OSA (obstructive sleep apnea) 07/13/2016   Chronic venous insufficiency 12/17/2015   Dental disease 09/16/2015   Gout 07/01/2015   Essential hypertension 07/01/2015   Avitaminosis D 07/01/2015   Hypothyroidism 07/01/2015   Constipation 02/16/2007    Past Medical History:  Diagnosis Date   Anxiety    Arthritis    Blood in stool    Depression    GERD (gastroesophageal reflux disease)    Hypertension    Sleep apnea    Thyroid disease    UTI (urinary tract infection)     Past Surgical History:  Procedure Laterality Date   HERNIA REPAIR     umbilical repair    Social History   Socioeconomic History   Marital status: Divorced    Spouse name: Not on file   Number of children: 1   Years of education: Not on file   Highest education level: Not on file  Occupational History   Occupation: retired  Tobacco Use  Smoking status: Never   Smokeless tobacco: Never  Vaping Use   Vaping status: Never Used  Substance and Sexual Activity   Alcohol use: No    Alcohol/week: 0.0 standard drinks of alcohol   Drug use: No   Sexual activity: Not Currently  Other Topics Concern   Not on file  Social History Narrative   Not on file   Social Drivers of Health   Financial Resource Strain: Low Risk  (03/15/2023)   Overall Financial Resource Strain (CARDIA)    Difficulty of Paying Living Expenses: Not hard at all  Food Insecurity: No Food Insecurity (03/15/2023)   Hunger Vital  Sign    Worried About Running Out of Food in the Last Year: Never true    Ran Out of Food in the Last Year: Never true  Transportation Needs: No Transportation Needs (03/15/2023)   PRAPARE - Administrator, Civil Service (Medical): No    Lack of Transportation (Non-Medical): No  Physical Activity: Inactive (03/15/2023)   Exercise Vital Sign    Days of Exercise per Week: 0 days    Minutes of Exercise per Session: 0 min  Stress: No Stress Concern Present (03/15/2023)   [employer] of Occupational Health - Occupational Stress Questionnaire    Feeling of Stress : Not at all  Social Connections: Unknown (07/05/2023)   Received from [facility]   Social Network    Social Network: Not on file  Intimate Partner Violence: Unknown (07/05/2023)   Received from [facility]   HITS    Physically Hurt: Not on file    Insult or Talk Down To: Not on file    Threaten Physical Harm: Not on file    Scream or Curse: Not on file    Family History  Problem Relation Age of Onset   Arthritis Mother    Alcohol abuse Father    Arthritis Father    Stroke Father    Arthritis Maternal Grandmother    Stroke Maternal Grandmother    Arthritis Maternal Grandfather    Arthritis Paternal Grandmother    Arthritis Paternal Grandfather    Colon cancer Neg Hx    Esophageal cancer Neg Hx    Rectal cancer Neg Hx    Stomach cancer Neg Hx      Review of Systems  Constitutional: Negative.  Negative for chills and fever.  HENT: Negative.  Negative for congestion and sore throat.   Respiratory: Negative.  Negative for cough and shortness of breath.   Cardiovascular:  Positive for palpitations. Negative for chest pain.  Gastrointestinal:  Negative for abdominal pain, nausea and vomiting.  Genitourinary: Negative.  Negative for dysuria and hematuria.  Skin: Negative.  Negative for rash.  Neurological:  Positive for dizziness.  All other systems reviewed and are negative.   Vitals:   03/22/24  0933  BP: (!) 142/90  Pulse: (!) 58  Temp: 98.3 F (36.8 C)  SpO2: 97%    Physical Exam Vitals reviewed.  Constitutional:      Appearance: Normal appearance.  HENT:     Head: Normocephalic.     Right Ear: Tympanic membrane, ear canal and external ear normal.     Left Ear: Tympanic membrane, ear canal and external ear normal.     Mouth/Throat:     Mouth: Mucous membranes are moist.     Pharynx: Oropharynx is clear.  Eyes:     Extraocular Movements: Extraocular movements intact.     Conjunctiva/sclera: Conjunctivae normal.  Pupils: Pupils are equal, round, and reactive to light.  Cardiovascular:     Rate and Rhythm: Normal rate and regular rhythm.     Pulses: Normal pulses.     Heart sounds: Normal heart sounds.  Pulmonary:     Effort: Pulmonary effort is normal.     Breath sounds: Normal breath sounds.  Musculoskeletal:     Cervical back: No tenderness.  Lymphadenopathy:     Cervical: No cervical adenopathy.  Skin:    General: Skin is warm and dry.     Capillary Refill: Capillary refill takes less than 2 seconds.  Neurological:     General: No focal deficit present.     Mental Status: She is alert and oriented to person, place, and time.  Psychiatric:        Mood and Affect: Mood normal.        Behavior: Behavior normal.      ASSESSMENT & PLAN: A total of 43 minutes was spent with the patient and counseling/coordination of care regarding preparing for this visit, review of most recent office visit notes, review of multiple chronic medical conditions and their management, review of all medications, differential diagnosis of chronic dizziness, review of most recent bloodwork results, review of health maintenance items, education on nutrition, prognosis, documentation, and need for follow up with PCP.   Problem List Items Addressed This Visit       Cardiovascular and Mediastinum   Essential hypertension   BP at goal on amlodipine 5 mg daily and lisinopril 40 mg  daily and metoprolol 100 mg BID and checking CMP and adjust as needed.       Paroxysmal atrial fibrillation (HCC)   She has been unwilling to start eliquis due to cost. She is counseled about the risk of stroke and mechanism of risk of stroke in A fib. She is willing to consider to try starting but worries about affordability ongoing         Musculoskeletal and Integument   Candidal skin infection   Lower abdominal area secondary to overlapping abdominal skin folds Recommend nystatin cream as needed      Relevant Medications   nystatin cream (MYCOSTATIN)     Other   Dizzy spells - Primary   Chronic but affecting quality of life Unknown trigger Blood work done today Must follow-up with PCP in the next couple of weeks      Relevant Orders   CBC with Differential/Platelet   Comprehensive metabolic panel with GFR   Vitamin B12   VITAMIN D 25 Hydroxy (Vit-D Deficiency, Fractures)   TSH   Patient Instructions  Dizziness Dizziness is a common problem. It makes you feel unsteady or light-headed. You may feel like you're about to faint. Dizziness can lead to getting hurt if you stumble or fall. It's more common to feel dizzy if you're an older adult. Many things can cause you to feel dizzy. These include: Medicines. Dehydration. This is when there's not enough water in your body. Illness. Follow these instructions at home: Eating and drinking  Drink enough fluid to keep your pee (urine) pale yellow. This helps keep you from getting dehydrated. Try to drink more clear fluids, such as water. Do not drink alcohol. Try to limit how much caffeine you take in. Try to limit how much salt, also called sodium, you take in. Activity Try not to make quick movements. Stand up slowly from sitting in a chair. Steady yourself until you feel okay. In the morning, first  sit up on the side of the bed. When you feel okay, hold onto something and slowly stand up. Do this until you know that  your balance is okay. If you need to stand in one place for a long time, move your legs often. Tighten and relax the muscles in your legs while you're standing. Do not drive or use machines if you feel dizzy. Avoid bending down if you feel dizzy. Place items in your home so you can reach them without leaning over. Lifestyle Do not smoke, vape, or use products with nicotine or tobacco in them. If you need help quitting, talk with your health care provider. Try to lower your stress level. You can do this by using methods like yoga or meditation. Talk with your provider if you need help. General instructions Watch your dizziness for any changes. Take your medicines only as told by your provider. Talk with your provider if you think you're dizzy because of a medicine you're taking. Tell a friend or a family member that you're feeling dizzy. If they spot any changes in your behavior, have them call your provider. Contact a health care provider if: Your dizziness doesn't go away, or you have new symptoms. Your dizziness gets worse. You feel like you may vomit. You have trouble hearing. You have a fever. You have neck pain or a stiff neck. You fall or get hurt. Get help right away if: You vomit each time you eat or drink. You have watery poop and can't eat or drink. You have trouble talking, walking, swallowing, or using your arms, hands, or legs. You feel very weak. You're bleeding. You're not thinking clearly, or you have trouble forming sentences. A friend or family member may spot this. Your vision changes, or you get a very bad headache. These symptoms may be an emergency. Call 911 right away. Do not wait to see if the symptoms will go away. Do not drive yourself to the hospital. This information is not intended to replace advice given to you by your health care provider. Make sure you discuss any questions you have with your health care provider. Document Revised: 09/08/2023 Document  Reviewed: 01/20/2023 Elsevier Patient Education  2024 Elsevier Inc.    Edwina Barth, MD [facility]

## 2024-03-22 NOTE — Assessment & Plan Note (Signed)
 BP at goal on amlodipine 5 mg daily and lisinopril 40 mg daily and metoprolol 100 mg BID and checking CMP and adjust as needed.

## 2024-03-22 NOTE — Assessment & Plan Note (Signed)
 Lower abdominal area secondary to overlapping abdominal skin folds Recommend nystatin cream as needed

## 2024-03-22 NOTE — Assessment & Plan Note (Signed)
 Chronic but affecting quality of life Unknown trigger Blood work done today Must follow-up with PCP in the next couple of weeks

## 2024-03-22 NOTE — Patient Instructions (Signed)
 Dizziness Dizziness is a common problem. It makes you feel unsteady or light-headed. You may feel like you're about to faint. Dizziness can lead to getting hurt if you stumble or fall. It's more common to feel dizzy if you're an older adult. Many things can cause you to feel dizzy. These include: Medicines. Dehydration. This is when there's not enough water in your body. Illness. Follow these instructions at home: Eating and drinking  Drink enough fluid to keep your pee (urine) pale yellow. This helps keep you from getting dehydrated. Try to drink more clear fluids, such as water. Do not drink alcohol. Try to limit how much caffeine you take in. Try to limit how much salt, also called sodium, you take in. Activity Try not to make quick movements. Stand up slowly from sitting in a chair. Steady yourself until you feel okay. In the morning, first sit up on the side of the bed. When you feel okay, hold onto something and slowly stand up. Do this until you know that your balance is okay. If you need to stand in one place for a long time, move your legs often. Tighten and relax the muscles in your legs while you're standing. Do not drive or use machines if you feel dizzy. Avoid bending down if you feel dizzy. Place items in your home so you can reach them without leaning over. Lifestyle Do not smoke, vape, or use products with nicotine or tobacco in them. If you need help quitting, talk with your health care provider. Try to lower your stress level. You can do this by using methods like yoga or meditation. Talk with your provider if you need help. General instructions Watch your dizziness for any changes. Take your medicines only as told by your provider. Talk with your provider if you think you're dizzy because of a medicine you're taking. Tell a friend or a family member that you're feeling dizzy. If they spot any changes in your behavior, have them call your provider. Contact a health care  provider if: Your dizziness doesn't go away, or you have new symptoms. Your dizziness gets worse. You feel like you may vomit. You have trouble hearing. You have a fever. You have neck pain or a stiff neck. You fall or get hurt. Get help right away if: You vomit each time you eat or drink. You have watery poop and can't eat or drink. You have trouble talking, walking, swallowing, or using your arms, hands, or legs. You feel very weak. You're bleeding. You're not thinking clearly, or you have trouble forming sentences. A friend or family member may spot this. Your vision changes, or you get a very bad headache. These symptoms may be an emergency. Call 911 right away. Do not wait to see if the symptoms will go away. Do not drive yourself to the hospital. This information is not intended to replace advice given to you by your health care provider. Make sure you discuss any questions you have with your health care provider. Document Revised: 09/08/2023 Document Reviewed: 01/20/2023 Elsevier Patient Education  2024 [employer].

## 2024-04-30 ENCOUNTER — Telehealth: Payer: Self-pay

## 2024-04-30 NOTE — Telephone Encounter (Signed)
 Copied from CRM [id number]. Topic: Clinical - Medical Advice >> Apr 27, 2024  4:22 PM Earnestine Goes B wrote: Reason for CRM:  pt called to speak with dr. Nicolette Barrio. Regarding dizzy spells she continues to have. Pt is requesting a call back  at [phone] available between 10am - 12pm or the afternoon

## 2024-04-30 NOTE — Telephone Encounter (Signed)
 Can have visit with me to discuss I do not know enough about her issue to advise.

## 2024-06-25 ENCOUNTER — Encounter: Payer: Self-pay | Admitting: Internal Medicine

## 2024-06-25 ENCOUNTER — Ambulatory Visit: Admitting: Internal Medicine

## 2024-06-25 ENCOUNTER — Other Ambulatory Visit: Payer: Self-pay | Admitting: Internal Medicine

## 2024-06-25 VITALS — BP 138/84 | HR 78 | Temp 98.2°F | Ht 63.0 in | Wt 201.0 lb

## 2024-06-25 DIAGNOSIS — E538 Deficiency of other specified B group vitamins: Secondary | ICD-10-CM

## 2024-06-25 DIAGNOSIS — R42 Dizziness and giddiness: Secondary | ICD-10-CM

## 2024-06-25 DIAGNOSIS — B372 Candidiasis of skin and nail: Secondary | ICD-10-CM

## 2024-06-25 MED ORDER — CYANOCOBALAMIN 1000 MCG/ML IJ SOLN
1000.0000 ug | Freq: Once | INTRAMUSCULAR | Status: AC
  Administered 2024-06-25: 1000 ug via INTRAMUSCULAR

## 2024-06-25 MED ORDER — NYSTATIN 100000 UNIT/GM EX CREA: 1.0000 | TOPICAL_CREAM | Freq: Two times a day (BID) | CUTANEOUS | 0 refills | Status: DC

## 2024-06-25 MED ORDER — FLUCONAZOLE 150 MG PO TABS: 150.0000 mg | ORAL_TABLET | Freq: Once | ORAL | 0 refills | Status: AC

## 2024-06-25 NOTE — Progress Notes (Unsigned)
   Subjective:   Patient ID: Frances [name], female    DOB: February 09, 1946, 78 y.o.   MRN: [id number]  HPI Te patient is a 78 YO female coming in for dizziness. Not taking eliquis  due to cost (never started). She started having this 2-3 months ago and feels like a progression but not sure maybe sudden around then. No numbness or weakness. No headaches or speech changes. 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 She is also having itching under breasts with rash and under stomach folds.   Review of Systems  Constitutional:  Positive for activity change.  HENT: Negative.    Eyes: Negative.   Respiratory:  Negative for cough, chest tightness and shortness of breath.   Cardiovascular:  Negative for chest pain, palpitations and leg swelling.  Gastrointestinal:  Negative for abdominal distention, abdominal pain, constipation, diarrhea, nausea and vomiting.  Musculoskeletal:  Positive for arthralgias.  Skin:  Positive for rash.       itching  Neurological:  Positive for dizziness and light-headedness.  Psychiatric/Behavioral: Negative.      Objective:  Physical Exam Constitutional:      Appearance: She is well-developed.  HENT:     Head: Normocephalic and atraumatic.     Comments:  right ear canal impacted with copious hard wax, examination post ear lavage canal is clear and no bleeding or complications noted.   Cardiovascular:     Rate and Rhythm: Normal rate and regular rhythm.  Pulmonary:     Effort: Pulmonary effort is normal. No respiratory distress.     Breath sounds: Normal breath sounds. No wheezing or rales.  Abdominal:     General: Bowel sounds are normal. There is no distension.     Palpations: Abdomen is soft.     Tenderness: There is no abdominal tenderness. There is no rebound.  Musculoskeletal:     Cervical back: Normal range of motion.  Skin:    General: Skin is warm and dry.  Neurological:     Mental Status:  She is alert and oriented to person, place, and time.     Cranial Nerves: No cranial nerve deficit.     Sensory: No sensory deficit.     Motor: No weakness.     Coordination: Coordination normal.     Vitals:   06/25/24 0911  BP: 138/84  Pulse: 78  Temp: 98.2 F (36.8 C)  TempSrc: Oral  SpO2: 98%  Weight: 201 lb (91.2 kg)  Height: 5' 3 (1.6 m)   Visit time 25 minutes in face to face communication with patient and coordination of care, additional 10 minutes spent in record review, coordination or care, ordering tests, communicating/referring to other healthcare professionals, documenting in medical records all on the same day of the visit for total time 35 minutes spent on the visit.   Assessment & Plan:  B12 given at visit

## 2024-06-25 NOTE — Patient Instructions (Signed)
 We have given you the b12 shot.  We will check an MRI of the brain.  We have sent in diflucan  to take for the yeast it is a pill.   We have sent in the nystatin  cream to use under breasts and stomach.

## 2024-06-26 ENCOUNTER — Telehealth: Payer: Self-pay

## 2024-06-26 ENCOUNTER — Other Ambulatory Visit (HOSPITAL_COMMUNITY): Payer: Self-pay

## 2024-06-26 ENCOUNTER — Encounter: Payer: Self-pay | Admitting: Internal Medicine

## 2024-06-26 NOTE — Telephone Encounter (Signed)
 Pharmacy Patient Advocate Encounter   Received notification from RX Request Messages that prior authorization for Nystatin  cream is required/requested.   Insurance verification completed.   The patient is insured through [facility] .   Per test claim: PA required; PA submitted to above mentioned insurance via CoverMyMeds Key/confirmation #/EOC [id number] Status is pending

## 2024-06-27 NOTE — Telephone Encounter (Signed)
 Pharmacy Patient Advocate Encounter  Received notification from [facility] that Prior Authorization for Nystatin  cream has been APPROVED from 12/21/23 to 12/19/24   PA #/Case ID/Reference #: [id number]

## 2024-06-28 ENCOUNTER — Other Ambulatory Visit (HOSPITAL_COMMUNITY): Payer: Self-pay

## 2024-06-28 DIAGNOSIS — E538 Deficiency of other specified B group vitamins: Secondary | ICD-10-CM | POA: Insufficient documentation

## 2024-06-28 NOTE — Assessment & Plan Note (Signed)
 Rx diflucan  to help as well as nystatin  topical.

## 2024-06-28 NOTE — Assessment & Plan Note (Signed)
 New problem at last visit not addressed. Recommended B12 every week for 4 weeks given first today. Then start oral 1000 mcg daily for life.

## 2024-06-28 NOTE — Assessment & Plan Note (Signed)
 New and concerning symptom. She does have paroxsymal a fib and never took eliquis  making stroke likely. She needs MRI brain which is ordered for worsening/new dizziness.

## 2024-06-28 NOTE — Telephone Encounter (Signed)
 PA has been submitted and documented in separate encounter, please sign off on rx in this encounter as PA team is unable to resolve RX requests. Thank you   Left a message at [facility] to notify of the approval

## 2024-07-04 ENCOUNTER — Telehealth: Payer: Self-pay

## 2024-07-04 ENCOUNTER — Ambulatory Visit (INDEPENDENT_AMBULATORY_CARE_PROVIDER_SITE_OTHER)

## 2024-07-04 ENCOUNTER — Ambulatory Visit: Payer: Self-pay

## 2024-07-04 DIAGNOSIS — E538 Deficiency of other specified B group vitamins: Secondary | ICD-10-CM

## 2024-07-04 MED ORDER — CYANOCOBALAMIN 1000 MCG/ML IJ SOLN
1000.0000 ug | Freq: Once | INTRAMUSCULAR | Status: AC
  Administered 2024-07-04: 1000 ug via INTRAMUSCULAR

## 2024-07-04 NOTE — Telephone Encounter (Signed)
 Pt called the office, spoke to pt about her concerns, pt had issue that she was being told her B12 was supposed to be Monthly at by a front desk staff when she was told by me to get it weekly. Reassure pt that it supposed to be weekly for 4 weeks per notes in Dr. Rollene visit notes. Schedule pt for her next visit.

## 2024-07-04 NOTE — Addendum Note (Signed)
 Addended by: LEAR, Madix Blowe P on: 07/04/2024 10:50 AM   Modules accepted: Orders

## 2024-07-04 NOTE — Progress Notes (Addendum)
After obtaining consent, and per orders of Dr. Sharlet Salina, injection of B12 given by Marrian Salvage. Patient instructed to report any adverse reaction to me immediately.

## 2024-07-04 NOTE — Telephone Encounter (Signed)
 Patient would like sooner PCP appt for f/u. Please contact patient if possible. Alternatively, would like to speak with PCP or her nurse.  If sooner appt available, can cancel Aug 5th visit.  C# [phone] H# [phone]  Patient voiced concerns because when she came for her B12 injections, the staff member asked her how often she is supposed to come in for B12. PCP documentation on 06/25/24 shows it is to be: New problem at last visit not addressed. Recommended B12 every week for 4 weeks given first today. Then start oral 1000 mcg daily for life. - she would like confirmation on frequency.  Also reports that pharmacy did not receive rx for nystatin  (transmission failed) and no rx for vitamin D .

## 2024-07-05 ENCOUNTER — Other Ambulatory Visit: Payer: Self-pay

## 2024-07-05 DIAGNOSIS — B372 Candidiasis of skin and nail: Secondary | ICD-10-CM

## 2024-07-05 MED ORDER — NYSTATIN 100000 UNIT/GM EX OINT: TOPICAL_OINTMENT | Freq: Two times a day (BID) | CUTANEOUS | 11 refills | Status: AC

## 2024-07-05 NOTE — Telephone Encounter (Signed)
 I have resent in patient medication

## 2024-07-07 ENCOUNTER — Ambulatory Visit
Admission: RE | Admit: 2024-07-07 | Discharge: 2024-07-07 | Disposition: A | Discharge status: Home / Self Care | Source: Ambulatory Visit | Attending: Internal Medicine | Admitting: Internal Medicine

## 2024-07-07 DIAGNOSIS — R29818 Other symptoms and signs involving the nervous system: Secondary | ICD-10-CM | POA: Diagnosis not present

## 2024-07-07 DIAGNOSIS — R42 Dizziness and giddiness: Secondary | ICD-10-CM

## 2024-07-07 DIAGNOSIS — R519 Headache, unspecified: Secondary | ICD-10-CM | POA: Diagnosis not present

## 2024-07-11 ENCOUNTER — Ambulatory Visit (INDEPENDENT_AMBULATORY_CARE_PROVIDER_SITE_OTHER)

## 2024-07-11 DIAGNOSIS — E538 Deficiency of other specified B group vitamins: Secondary | ICD-10-CM

## 2024-07-11 MED ORDER — CYANOCOBALAMIN 1000 MCG/ML IJ SOLN
1000.0000 ug | Freq: Once | INTRAMUSCULAR | Status: AC
  Administered 2024-07-11: 1000 ug via INTRAMUSCULAR

## 2024-07-11 NOTE — Progress Notes (Signed)
Pt here for monthly B12 injection per   B12 1000mcg given IM and pt tolerated injection well.    

## 2024-07-16 ENCOUNTER — Ambulatory Visit: Payer: Self-pay | Admitting: Internal Medicine

## 2024-07-19 ENCOUNTER — Ambulatory Visit (INDEPENDENT_AMBULATORY_CARE_PROVIDER_SITE_OTHER)

## 2024-07-19 DIAGNOSIS — E538 Deficiency of other specified B group vitamins: Secondary | ICD-10-CM | POA: Diagnosis not present

## 2024-07-19 MED ORDER — CYANOCOBALAMIN 1000 MCG/ML IJ SOLN
1000.0000 ug | Freq: Once | INTRAMUSCULAR | Status: AC
  Administered 2024-07-19: 1000 ug via INTRAMUSCULAR

## 2024-07-19 NOTE — Progress Notes (Signed)
 Please sign off as Md is out office today!  Pt here for monthly B12 injection per   B12 1000mcg given IM and pt tolerated injection well.  Patient responded well to vaccine

## 2024-07-20 ENCOUNTER — Telehealth: Payer: Self-pay

## 2024-07-20 NOTE — Telephone Encounter (Signed)
 Patient wanted to know if this can be sent in as a prescription. She does not want to have to pay out of pocket

## 2024-07-20 NOTE — Telephone Encounter (Signed)
 Vitamin D  is 5000 units over the counter and B12 is 1000 mcg daily over the counter.

## 2024-07-23 MED ORDER — VITAMIN B-12 1000 MCG PO TABS: 1000.0000 ug | ORAL_TABLET | Freq: Every day | ORAL | 3 refills | Status: AC

## 2024-07-23 MED ORDER — VITAMIN D3 125 MCG (5000 UT) PO CAPS: 5000.0000 [IU] | ORAL_CAPSULE | Freq: Every day | ORAL | 3 refills | Status: AC

## 2024-07-23 NOTE — Addendum Note (Signed)
 Addended by: ROLLENE NORRIS A on: 07/23/2024 02:25 PM   Modules accepted: Orders

## 2024-07-23 NOTE — Telephone Encounter (Signed)
 I have prescribed them but since they are otc insurance may or may not cover them

## 2024-07-23 NOTE — Telephone Encounter (Signed)
 Called patient and informed her about the prescriptions that were sent in and there were no questions or concerns patient verbalized she understood

## 2024-07-24 ENCOUNTER — Encounter: Payer: Self-pay | Admitting: Internal Medicine

## 2024-07-24 ENCOUNTER — Ambulatory Visit (INDEPENDENT_AMBULATORY_CARE_PROVIDER_SITE_OTHER): Admitting: Internal Medicine

## 2024-07-24 VITALS — BP 140/80 | HR 55 | Temp 98.1°F | Ht 63.0 in | Wt 199.0 lb

## 2024-07-24 DIAGNOSIS — R42 Dizziness and giddiness: Secondary | ICD-10-CM

## 2024-07-24 DIAGNOSIS — I480 Paroxysmal atrial fibrillation: Secondary | ICD-10-CM | POA: Diagnosis not present

## 2024-07-24 DIAGNOSIS — I679 Cerebrovascular disease, unspecified: Secondary | ICD-10-CM

## 2024-07-24 DIAGNOSIS — I10 Essential (primary) hypertension: Secondary | ICD-10-CM | POA: Diagnosis not present

## 2024-07-24 DIAGNOSIS — E559 Vitamin D deficiency, unspecified: Secondary | ICD-10-CM

## 2024-07-24 DIAGNOSIS — E538 Deficiency of other specified B group vitamins: Secondary | ICD-10-CM | POA: Diagnosis not present

## 2024-07-24 MED ORDER — PRAVASTATIN SODIUM 20 MG PO TABS: 20.0000 mg | ORAL_TABLET | Freq: Every day | ORAL | 3 refills | Status: AC

## 2024-07-24 MED ORDER — APIXABAN 5 MG PO TABS: 5.0000 mg | ORAL_TABLET | Freq: Two times a day (BID) | ORAL | 3 refills | Status: AC

## 2024-07-24 NOTE — Patient Instructions (Addendum)
 For the vitamin D  5000 units daily over the counter.  For the B12 1000 mcg daily over the counter.  We have sent in pravastatin  to take for cholesterol and plaque prevention.  We have sent in the eliquis  to start taking to help prevent stroke due to the Atrial Fibrillation.

## 2024-07-24 NOTE — Progress Notes (Signed)
 /  Subjective:   Patient ID: Frances [name], female    DOB: 10-25-1946, 78 y.o.   MRN: [id number]  Discussed the use of AI scribe software for clinical note transcription with the patient, who gave verbal consent to proceed.  History of Present Illness Frances [name] is a 78 year old female with atrial fibrillation and chronic small vessel disease who presents for a review of her MRI results and management of her conditions.  She has a history of atrial fibrillation diagnosed about a year and a half to two years ago, experiencing 'funny heartbeats' and sometimes an increased heart rate. She has been hesitant to start Eliquis , which was previously prescribed but not taken, and inquires about its effectiveness compared to aspirin, which she took for 19 years before stopping two years ago. She is currently taking metoprolol , lisinopril , and amlodipine  for her heart conditions and blood pressure management. Recently, she reduced her metoprolol  dosage to once a day for about a week and has noticed a difference.  The recent MRI did not show any signs of stroke, brain tumor, or mass, but it revealed chronic small vessel disease with plaque buildup in the brain's blood vessels. She is concerned about her risk of stroke and is not currently on a cholesterol medication.  She has been experiencing severe dizzy spells for the past two months, which have improved significantly after starting B12 injections. The dizziness and room spinning have subsided since beginning the B12 treatment. She reports feeling tired today but denies current severe dizzy spells or room spinning since starting B12 injections.   Review of Systems  Constitutional: Negative.   HENT: Negative.    Eyes: Negative.   Respiratory:  Negative for cough, chest tightness and shortness of breath.   Cardiovascular:  Positive for palpitations. Negative for chest pain and leg swelling.  Gastrointestinal:  Negative for abdominal distention,  abdominal pain, constipation, diarrhea, nausea and vomiting.  Musculoskeletal: Negative.   Skin: Negative.   Neurological:  Positive for dizziness.  Psychiatric/Behavioral: Negative.      Objective:  Physical Exam Constitutional:      Appearance: She is well-developed.  HENT:     Head: Normocephalic and atraumatic.  Cardiovascular:     Rate and Rhythm: Normal rate. Rhythm irregular.  Pulmonary:     Effort: Pulmonary effort is normal. No respiratory distress.     Breath sounds: Normal breath sounds. No wheezing or rales.  Abdominal:     General: Bowel sounds are normal. There is no distension.     Palpations: Abdomen is soft.     Tenderness: There is no abdominal tenderness. There is no rebound.  Musculoskeletal:     Cervical back: Normal range of motion.  Skin:    General: Skin is warm and dry.  Neurological:     Mental Status: She is alert and oriented to person, place, and time.     Coordination: Coordination normal.     Vitals:   07/24/24 1013 07/24/24 1020  BP: (!) 140/80 (!) 140/80  Pulse: (!) 55   Temp: 98.1 F (36.7 C)   TempSrc: Oral   SpO2: 97%   Weight: 199 lb (90.3 kg)   Height: 5' 3 (1.6 m)     Assessment and Plan Assessment & Plan Atrial fibrillation   She is hesitant to start Eliquis  despite its recommendation for stroke risk reduction. She is interested in the Watchman device and requires a cardiologist evaluation. Prescribe Eliquis  twice daily and consult a cardiologist regarding Watchman device  candidacy.  Chronic small vessel cerebrovascular disease   MRI indicates disease with plaque buildup, increasing stroke risk. She is not on cholesterol medication, which could stabilize and prevent plaque formation. Initiate low-dose statin for plaque stabilization and prevention. Re-evaluate cholesterol levels in January or February.  Dizziness   Severe dizziness has improved with B12 injections, suggesting a link to vitamin B12 deficiency. Continue  vitamin B12 supplementation.  Vitamin B12 deficiency   B12 injections have alleviated dizziness. She plans to continue supplementation to maintain benefits. Continue vitamin B12 supplementation, either by prescription or over-the-counter, as insurance coverage allows.  Hypertension   Managed with metoprolol , lisinopril , and amlodipine . Metoprolol  is taken once daily instead of prescribed twice daily, possibly contributing to increased heart palpitations. Blood pressure is well-controlled. Encourage adherence to metoprolol  twice daily to manage heart palpitations. Continue lisinopril  and amlodipine  as prescribed.

## 2024-07-26 DIAGNOSIS — I679 Cerebrovascular disease, unspecified: Secondary | ICD-10-CM | POA: Insufficient documentation

## 2024-07-26 NOTE — Assessment & Plan Note (Signed)
 MRI indicates disease with plaque buildup, increasing stroke risk. She is not on cholesterol medication, which could stabilize and prevent plaque formation. Initiate low-dose pravastatin  for plaque stabilization and prevention. Re-evaluate cholesterol levels in January or February.

## 2024-07-26 NOTE — Assessment & Plan Note (Signed)
 Managed with metoprolol , lisinopril , and amlodipine . Metoprolol  is taken once daily instead of prescribed twice daily, possibly contributing to increased heart palpitations. Blood pressure is well-controlled. Encourage adherence to metoprolol  twice daily to manage heart palpitations. Continue lisinopril  and amlodipine  as prescribed.

## 2024-07-26 NOTE — Assessment & Plan Note (Signed)
 She is hesitant to start Eliquis  despite its recommendation for stroke risk reduction. She is interested in the Watchman device and requires a cardiologist evaluation. Prescribe Eliquis  twice daily and consult a cardiologist regarding Watchman device candidacy.

## 2024-07-26 NOTE — Assessment & Plan Note (Signed)
 Dizziness is improved with supplementation and will continue oral lifelong. Rx is done to aid compliance.

## 2024-07-26 NOTE — Assessment & Plan Note (Signed)
 REviewed MRI findings with her. B12 supplementation has decreased symptoms markedly. Continue lifelong B12 supplementation.

## 2024-07-26 NOTE — Assessment & Plan Note (Signed)
 Vitamin D  5000 units daily rx done to help aid compliance.

## 2024-08-23 ENCOUNTER — Other Ambulatory Visit: Payer: Self-pay | Admitting: Internal Medicine

## 2024-08-23 DIAGNOSIS — I10 Essential (primary) hypertension: Secondary | ICD-10-CM

## 2024-09-12 ENCOUNTER — Ambulatory Visit: Payer: Self-pay | Admitting: Internal Medicine

## 2024-09-12 NOTE — Telephone Encounter (Signed)
 Patient requesting new prescription for Colchicine .  LOV:07/24/24

## 2024-09-12 NOTE — Telephone Encounter (Signed)
 FYI Only or Action Required?: Action required by provider: medication refill request.  Patient was last seen in primary care on 07/24/2024 by Rollene Almarie LABOR, MD.  Called Nurse Triage reporting Medication Refill (colchicine ).  Symptoms began several days ago.  Interventions attempted: OTC medications: tylenol.  Symptoms are: gradually worsening.  Triage Disposition: See PCP When Office is Open (Within 3 Days)  Patient/caregiver understands and will follow disposition?: No, wishes to speak with PCP  Copied from CRM #[id number]. Topic: Clinical - Red Word Triage >> Sep 12, 2024  4:54 PM Frances [name] wrote: [facility] that prompted transfer to Nurse Triage: Patient has a gout flare up on her right foot that's painful. Reason for Disposition  [1] MODERATE pain (e.g., interferes with normal activities, limping) AND [2] present > 3 days  Answer Assessment - Initial Assessment Questions Patient requesting new prescription for Colchicine .  No appts today, next available appt next Tuesday with PCP. Pt declined appts and requests new prescription and CALL BACK.  Advised UC/ED if symptoms worsen  1. ONSET: When did the pain start?      Gout flare up 2 days ago 2. LOCATION: Where is the pain located?      Right foot 3. PAIN: How bad is the pain?    (Scale 1-10; or mild, moderate, severe)     With tylenol 7/10, 10/10 without tylenol 4. WORK OR EXERCISE: Has there been any recent work or exercise that involved this part of the body?      no 5. CAUSE: What do you think is causing the foot pain?     gout 6. OTHER SYMPTOMS: Do you have any other symptoms? (e.g., leg pain, rash, fever, numbness)    Right foot; Swollen, tingling, numbness  Protocols used: Foot Pain-A-AH

## 2024-09-13 ENCOUNTER — Ambulatory Visit: Payer: Self-pay

## 2024-09-13 NOTE — Telephone Encounter (Signed)
 Please send in refill as I do not see this on patent medication list

## 2024-09-13 NOTE — Telephone Encounter (Signed)
 FYI Only or Action Required?: Action required by provider: medication refill request.  Patient was last seen in primary care on 07/24/2024 by Rollene Almarie LABOR, MD.  Called Nurse Triage reporting Foot Pain.  Symptoms began patient would not provide information.  Interventions attempted: Nothing.  Symptoms are: gradually worsening.  Triage Disposition: See HCP Within 4 Hours (Or PCP Triage)  Patient/caregiver understands and will follow disposition?: No, wishes to speak with PCP     Copied from CRM #[id number]. Topic: Clinical - Red Word Triage >> Sep 13, 2024  4:04 PM Drema MATSU wrote: Red Word that prompted transfer to Nurse Triage: Patient is still in a lot of pain from not having her medication. Reason for Disposition  [1] SEVERE pain (e.g., excruciating, unable to do any normal activities) AND [2] not improved after 2 hours of pain medicine  Answer Assessment - Initial Assessment Questions Patient states she is having a gout flair up and states her pain is severe in her foot. Patient did not want to answer any more questions regarding symptoms and states she already spoke to someone regarding symptoms yesterday. This RN offered to make an appointment at one of the UC for symptoms and patient refused. She states that the provider normally just calls in colchicine  medication to the pharmacy for her. Med not on active med list. Patient requesting a call back regarding medication request. Pharmacy listed below.    [id number] [address] [place], [place] - [address]  [address] [place] [address]  Protocols used: Foot Pain-A-AH

## 2024-09-13 NOTE — Telephone Encounter (Signed)
 Please advise , pharmacy is on file

## 2024-09-14 ENCOUNTER — Ambulatory Visit: Payer: Self-pay

## 2024-09-14 MED ORDER — PREDNISONE 20 MG PO TABS: 40.0000 mg | ORAL_TABLET | Freq: Every day | ORAL | 0 refills | Status: DC

## 2024-09-14 NOTE — Telephone Encounter (Signed)
 I did call the patient back and explained to her as to why possibly the provider sent in the prednisone  instead of the colchicine  and told her how to take this medication and if her symptoms are to get worse to call us  back first thing Monday morning and she also states that she is a lot of pain and wanted to know if something for pain was called in she says that tylenol helps I also told her to try ibuprofen pt verbalized she understood

## 2024-09-14 NOTE — Telephone Encounter (Signed)
 FYI Only or Action Required?: Action required by provider: clinical question for provider and update on patient condition.  Patient was last seen in primary care on 07/24/2024 by Rollene Almarie LABOR, MD.  Called Nurse Triage reporting Medication Problem.  Symptoms began several days ago.  Interventions attempted: Nothing.  Symptoms are: gradually worsening.  Triage Disposition: Call PCP Within 24 Hours  Patient/caregiver understands and will follow disposition?: Yes Patient called on 9/24 requesting colchicine  for gout flare. Per chart review, patient was prescribed Colchicine  by Roselie, NP in 2021. But the medication was discontinued 06/24/20 by PCP.  RN advised that an appt would be needed due to this being an acute flare. Offered appt for today, pt declined stating that she is unable to get around today.  Pt requesting callback from provider to discuss why prednisone  is the treatment option vs colchicine  and why she wasn't notified when the order was sent.    Copied from CRM [id number]. Topic: Clinical - Red Word Triage >> Sep 14, 2024  1:26 PM Thersia BROCKS wrote: [facility] that prompted transfer to Nurse Triage: Patient called in stated she is still having gout flareup in her foot and has been waiting on medication, I did inform her that Dr.Crawford sent her over  predniSONE  (DELTASONE ) 20 MG tablet , she stated she has never took that before and wanted some information regarding this Reason for Disposition  [1] Caller requests to speak ONLY to PCP AND [2] NON-URGENT question  Answer Assessment - Initial Assessment Questions 1. REASON FOR CALL or QUESTION: What is your reason for calling today? or How can I best     Patient called on 9/24 requesting colchicine  for gout.  PCP sent order for prednisone .  Pt has several questions regarding why prednisone  was ordered  2. CALLER: Document the source of call. (e.g., laboratory staff, caregiver or patient).     Patient  Protocols used: PCP  Call - No Triage-A-AH

## 2024-09-14 NOTE — Telephone Encounter (Signed)
 I did not send to DOD because this medication is to treat attacks against flare up but at the time pt states she was already having one. I thought that this medication was ok to hold till PCP came back in the next morning.

## 2024-09-14 NOTE — Telephone Encounter (Signed)
 This was a high priority message and should have gone to DOD. Please in future anything high priority needs addressed same day. Rx prednisone  sent in if not improving needs visit with anyone for acute.

## 2024-10-09 ENCOUNTER — Ambulatory Visit: Payer: Self-pay | Admitting: *Deleted

## 2024-10-09 NOTE — Telephone Encounter (Signed)
 FYI Only or Action Required?: FYI only for provider.  Patient was last seen in primary care on 07/24/2024 by Rollene Almarie LABOR, MD.  Called Nurse Triage reporting Chest Pain.  Symptoms began several days ago.  Interventions attempted: OTC medications: tylenol .  Symptoms are: gradually worsening.  Triage Disposition: Go to ED Now (or PCP Triage)  Patient/caregiver understands and will follow disposition?: No, refuses disposition   CAL notified patient does not want to go to ED as recommended.                 Copied from CRM [id number]. Topic: Clinical - Red Word Triage >> Oct 09, 2024  4:02 PM Rea ORN wrote: Red Word that prompted transfer to Nurse Triage: pain in rib cage on right side that began 2 days ago. Radiates from ribs to back Reason for Disposition  Taking a deep breath makes pain worse  Answer Assessment - Initial Assessment Questions Recommended ED now due to sx and patient reports she never started eliquis  back in August as prescribed due to taking so many medications. Hx Afib . Now reports right rib pain and back pain with coughing and bending x 2 days .  Reports breathing in with some pain in right rib area. Pain severe at times. Patient does not want to go to ED due to copay issues and can not afford on fixed income. CAL notified of refusal for ED. Please advise.        1. LOCATION: Where does it hurt?       Right rib area and back  2. RADIATION: Does the pain go anywhere else? (e.g., into neck, jaw, arms, back)     Back  3. ONSET: When did the chest pain begin? (Minutes, hours or days)      2 days ago  4. PATTERN: Does the pain come and go, or has it been constant since it started?  Does it get worse with exertion?      Comes and goes with coughing bending and moving certain positions and pulling feeling  5. DURATION: How long does it last (e.g., seconds, minutes, hours)     Few seconds and better when sitting up  6. SEVERITY: How  bad is the pain?  (e.g., Scale 1-10; mild, moderate, or severe)     At rest some discomfort but severe sharp with movement  7. CARDIAC RISK FACTORS: Do you have any history of heart problems or risk factors for heart disease? (e.g., angina, prior heart attack; diabetes, high blood pressure, high cholesterol, smoker, or strong family history of heart disease)     Hx HTN A fib  8. PULMONARY RISK FACTORS: Do you have any history of lung disease?  (e.g., blood clots in lung, asthma, emphysema, birth control pills)     na 9. CAUSE: What do you think is causing the chest pain?     Not sure  10. OTHER SYMPTOMS: Do you have any other symptoms? (e.g., dizziness, nausea, vomiting, sweating, fever, difficulty breathing, cough)       Right rib pain  and back pain worsening with cough and movement  pain taking deep breath in , also reports skin issues scaly skin and saw article skin issues can happen when taking amlodipine . 11. PREGNANCY: Is there any chance you are pregnant? When was your last menstrual period?       na  Protocols used: Chest Pain-A-AH

## 2024-10-10 ENCOUNTER — Encounter (HOSPITAL_COMMUNITY): Payer: Self-pay

## 2024-10-10 ENCOUNTER — Emergency Department (HOSPITAL_COMMUNITY)

## 2024-10-10 ENCOUNTER — Emergency Department (HOSPITAL_COMMUNITY)
Admission: EM | Admit: 2024-10-10 | Discharge: 2024-10-10 | Disposition: A | Discharge status: Home / Self Care | Attending: Emergency Medicine | Admitting: Emergency Medicine

## 2024-10-10 ENCOUNTER — Telehealth: Payer: Self-pay | Admitting: Cardiovascular Disease

## 2024-10-10 ENCOUNTER — Ambulatory Visit: Payer: Self-pay

## 2024-10-10 DIAGNOSIS — R079 Chest pain, unspecified: Secondary | ICD-10-CM | POA: Diagnosis present

## 2024-10-10 DIAGNOSIS — I10 Essential (primary) hypertension: Secondary | ICD-10-CM | POA: Diagnosis not present

## 2024-10-10 DIAGNOSIS — Z7901 Long term (current) use of anticoagulants: Secondary | ICD-10-CM | POA: Insufficient documentation

## 2024-10-10 DIAGNOSIS — Z79899 Other long term (current) drug therapy: Secondary | ICD-10-CM | POA: Diagnosis not present

## 2024-10-10 DIAGNOSIS — R0789 Other chest pain: Secondary | ICD-10-CM | POA: Diagnosis not present

## 2024-10-10 DIAGNOSIS — M546 Pain in thoracic spine: Secondary | ICD-10-CM | POA: Diagnosis not present

## 2024-10-10 LAB — I-STAT CHEM 8, ED
BUN: 16 mg/dL (ref 8–23)
Calcium, Ion: 1.12 mmol/L — ABNORMAL LOW (ref 1.15–1.40)
Chloride: 106 mmol/L (ref 98–111)
Creatinine, Ser: 1.1 mg/dL — ABNORMAL HIGH (ref 0.44–1.00)
Glucose, Bld: 95 mg/dL (ref 70–99)
HCT: 43 % (ref 36.0–46.0)
Hemoglobin: 14.6 g/dL (ref 12.0–15.0)
Potassium: 3.6 mmol/L (ref 3.5–5.1)
Sodium: 144 mmol/L (ref 135–145)
TCO2: 26 mmol/L (ref 22–32)

## 2024-10-10 LAB — COMPREHENSIVE METABOLIC PANEL WITH GFR
ALT: 14 U/L (ref 0–44)
AST: 19 U/L (ref 15–41)
Albumin: 3.6 g/dL (ref 3.5–5.0)
Alkaline Phosphatase: 58 U/L (ref 38–126)
Anion gap: 8 (ref 5–15)
BUN: 13 mg/dL (ref 8–23)
CO2: 23 mmol/L (ref 22–32)
Calcium: 8.7 mg/dL — ABNORMAL LOW (ref 8.9–10.3)
Chloride: 108 mmol/L (ref 98–111)
Creatinine, Ser: 1.06 mg/dL — ABNORMAL HIGH (ref 0.44–1.00)
GFR, Estimated: 54 mL/min — ABNORMAL LOW (ref >60)
Glucose, Bld: 95 mg/dL (ref 70–99)
Potassium: 3.6 mmol/L (ref 3.5–5.1)
Sodium: 139 mmol/L (ref 135–145)
Total Bilirubin: 0.8 mg/dL (ref 0.0–1.2)
Total Protein: 7.1 g/dL (ref 6.5–8.1)

## 2024-10-10 LAB — CBC
HCT: 43.2 % (ref 36.0–46.0)
Hemoglobin: 14.2 g/dL (ref 12.0–15.0)
MCH: 28.8 pg (ref 26.0–34.0)
MCHC: 32.9 g/dL (ref 30.0–36.0)
MCV: 87.6 fL (ref 80.0–100.0)
Platelets: 305 K/uL (ref 150–400)
RBC: 4.93 MIL/uL (ref 3.87–5.11)
RDW: 14.9 % (ref 11.5–15.5)
WBC: 6.3 K/uL (ref 4.0–10.5)
nRBC: 0 % (ref 0.0–0.2)

## 2024-10-10 LAB — TROPONIN I (HIGH SENSITIVITY): Troponin I (High Sensitivity): 4 ng/L (ref <18)

## 2024-10-10 LAB — LIPASE, BLOOD: Lipase: 24 U/L (ref 11–51)

## 2024-10-10 MED ORDER — IOHEXOL 350 MG/ML SOLN
75.0000 mL | Freq: Once | INTRAVENOUS | Status: AC | PRN
  Administered 2024-10-10: 75 mL via INTRAVENOUS

## 2024-10-10 NOTE — Telephone Encounter (Signed)
 Needs to go to ER to be seen urgently

## 2024-10-10 NOTE — Telephone Encounter (Signed)
 Pt called in to report right sided rib cage and shoulder pain.  Pain is more intense with movement such as bending over or reaching for something.  Has taken tylenol dulled the pain but did not take pain away.   Pt also concerned that amlodipine  could be causing bleeding under the skin.  Has purple spots to lower extremities.  Has not started Eliquis  doesn't know if it is safe to take if she is in fact bleeding from amlodipine .  Pt spoke with PCP office today was advised to go to ED for evaluation.  Pt doesn't want to go to ED d/t cost concern has limited income.  Reports has an OV with PCP on 10/19/24.  Pt reports right sided pain started 2-3 days ago.  Denies recent strain with movement.  Reports has a new 25 lb dog that likes to pounce on her chest sometimes to encourage her to get up. Denies nausea, sob, and left arm pain.  Has some pain with deep breath Advised pt symptoms are not typical of heart attack pt should follow PCP office recommendation for ED evaluation. Pt reports doesn't want to go to ED advised Urgent care is an option but they have limited resources so may still need ED care.  Pt expresses understanding.

## 2024-10-10 NOTE — Discharge Instructions (Signed)
 You were seen in the emergency department for chief complaint of right sided chest pain.  After evaluation it appears that you are having chest wall pain.  You likely strained the muscles in the side of your chest and back.  It does not appear that you have any life-threatening emergencies such as a pulmonary embolus or any broken bones.   It would be helpful to make sure that you are wearing a supportive bra to help prevent overuse of the muscles in your back.  Other findings include minimal atherosclerotic coronary artery disease which means that there is a bit of buildup of calcium in the arteries on your heart.  You may follow-up with your primary care physician to address this finding.  Otherwise you may take Tylenol for pain.  You may also talk to your primary care doctor about potentially starting some physical therapy for your upper back and right chest pain.  Lastly if you were diagnosed in the past with paroxysmal atrial fibrillation based on your risk stratifying scores you should be taking a blood thinner.  Please touch base with your primary care doctor as soon as possible and begin taking your Eliquis  as directed.

## 2024-10-10 NOTE — ED Triage Notes (Signed)
 Pt having right flank pain and back for past 2-3 days. No NVD. Hx of Afib, prescribed Elliquis but has never taken it per patient.

## 2024-10-10 NOTE — Telephone Encounter (Signed)
 Pt c/o of Chest Pain: STAT if active (IN THIS MOMENT) CP, including tightness, pressure, jaw pain, shoulder/upper arm/back pain, SOB, nausea, and vomiting.  1. Are you having CP right now (tightness, pressure, or discomfort)? Pain in her right side rib cage and in her back.  2. Are you experiencing any other symptoms (ex. SOB, nausea, vomiting, sweating)? No  3. How long have you been experiencing CP? 3 days   4. Is your CP continuous or coming and going? Consistent   5. Have you taken Nitroglycerin? No   6. If CP returns before callback, please consider calling 911. ?   Pt c/o medication issue:  1. Name of Medication:   amLODipine  (NORVASC ) 5 MG tablet    2. How are you currently taking this medication (dosage and times per day)?  Take 1 tablet (5 mg total) by mouth daily.       3. Are you having a reaction (difficulty breathing--STAT)? No  4. What is your medication issue? Pt stated she seen concerns about this medication causing people to bleed under their skin and have some spots. She's experiencing that and said she's having concerns. Please advise

## 2024-10-10 NOTE — Telephone Encounter (Signed)
 Called patient and advise her to go to the ER she verbalized she understood

## 2024-10-10 NOTE — Telephone Encounter (Signed)
**Note De-identified  [place] Obfuscation** Please advise 

## 2024-10-10 NOTE — Telephone Encounter (Signed)
 Patient called to ask why no one called her back after her message yesterday.  Explained that Dr. Marcel advice for her was to go to the ED and that notation was just made this morning.  Reminded patient that she was advised by triage RN yesterday to go to ED as well. Patient disconnected call No triage performed.   Copied from CRM [id number]. Topic: Clinical - Red Word Triage >> Oct 10, 2024 11:22 AM Mesmerise C wrote: [facility] that prompted transfer to Nurse Triage: Patient stated' still having same pain in her rib cage

## 2024-10-10 NOTE — ED Provider Notes (Signed)
 [facility] Provider Note   CSN: [id number] Arrival date & time: 10/10/24  1304     Patient presents with: Flank Pain and Back Pain   Frances [name] is a 78 y.o. female with a past medical history of paroxysmal atrial fibrillation, hypertension who is not currently compliant with her Eliquis  who presents emergency department chief complaint of right sided chest pain.  Onset of symptoms was over the past 2 to 3 days.  Patient is unsure if she may have moved a television stand.  The pain is worse when she changes position breathes very deeply twists or raises her right arm.  She denies any loss of consciousness, chest pain, shortness of breath, hemoptysis.  She has no history of blood clots.  She denies    Flank Pain  Back Pain      Prior to Admission medications   Medication Sig Start Date End Date Taking? Authorizing Provider  amLODipine  (NORVASC ) 5 MG tablet Take 1 tablet (5 mg total) by mouth daily. 01/12/24   Rollene Almarie LABOR, MD  apixaban  (ELIQUIS ) 5 MG TABS tablet Take 1 tablet (5 mg total) by mouth 2 (two) times daily. 07/24/24   Rollene Almarie LABOR, MD  Cholecalciferol (VITAMIN D3) 125 MCG (5000 UT) CAPS Take 1 capsule (5,000 Units total) by mouth daily. 07/23/24   Rollene Almarie LABOR, MD  cyanocobalamin  (VITAMIN B12) 1000 MCG tablet Take 1 tablet (1,000 mcg total) by mouth daily. 07/23/24   Rollene Almarie LABOR, MD  levothyroxine  (SYNTHROID ) 50 MCG tablet Take 1 tablet (50 mcg total) by mouth daily before breakfast. 01/12/24   Rollene Almarie LABOR, MD  lisinopril  (ZESTRIL ) 40 MG tablet TAKE 1 TABLET BY MOUTH EVERY DAY 08/23/24   Rollene Almarie LABOR, MD  metoprolol  succinate (TOPROL -XL) 100 MG 24 hr tablet Take 1 tablet (100 mg total) by mouth 2 (two) times daily. 01/12/24   Rollene Almarie LABOR, MD  nystatin  ointment (MYCOSTATIN ) Apply topically 2 (two) times daily. 07/05/24   Rollene Almarie LABOR, MD  pravastatin  (PRAVACHOL ) 20  MG tablet Take 1 tablet (20 mg total) by mouth daily. 07/24/24   Rollene Almarie LABOR, MD  predniSONE  (DELTASONE ) 20 MG tablet Take 2 tablets (40 mg total) by mouth daily with breakfast. 09/14/24   Rollene Almarie LABOR, MD    Allergies: Patient has no known allergies.    Review of Systems  Genitourinary:  Positive for flank pain.  Musculoskeletal:  Positive for back pain.    Updated Vital Signs BP (!) 157/76   Pulse 69   Temp 98.6 F (37 C) (Oral)   Resp 18   SpO2 99%   Physical Exam Vitals and nursing note reviewed.  Constitutional:      General: She is not in acute distress.    Appearance: She is well-developed. She is not diaphoretic.  HENT:     Head: Normocephalic and atraumatic.     Right Ear: External ear normal.     Left Ear: External ear normal.     Nose: Nose normal.     Mouth/Throat:     Mouth: Mucous membranes are moist.  Eyes:     General: No scleral icterus.    Conjunctiva/sclera: Conjunctivae normal.  Cardiovascular:     Rate and Rhythm: Normal rate and regular rhythm.     Heart sounds: Normal heart sounds. No murmur heard.    No friction rub. No gallop.  Pulmonary:     Effort: Pulmonary effort is normal. No  respiratory distress.     Breath sounds: Normal breath sounds.  Chest:     Chest wall: Tenderness present.    Abdominal:     General: Bowel sounds are normal. There is no distension.     Palpations: Abdomen is soft. There is no mass.     Tenderness: There is no abdominal tenderness. There is no guarding.  Musculoskeletal:       Arms:     Cervical back: Normal range of motion.     Comments: Reproducible chest wall and upper back tenderness worse with movement of the right arm, palpation, twisting  Skin:    General: Skin is warm and dry.  Neurological:     Mental Status: She is alert and oriented to person, place, and time.  Psychiatric:        Behavior: Behavior normal.     (all labs ordered are listed, but only abnormal results are  displayed) Labs Reviewed  COMPREHENSIVE METABOLIC PANEL WITH GFR - Abnormal; Notable for the following components:      Result Value   Creatinine, Ser 1.06 (*)    Calcium 8.7 (*)    GFR, Estimated 54 (*)    All other components within normal limits  I-STAT CHEM 8, ED - Abnormal; Notable for the following components:   Creatinine, Ser 1.10 (*)    Calcium, Ion 1.12 (*)    All other components within normal limits  CBC  LIPASE, BLOOD  URINALYSIS, ROUTINE W REFLEX MICROSCOPIC  TROPONIN I (HIGH SENSITIVITY)  TROPONIN I (HIGH SENSITIVITY)    EKG: None  Radiology: CT Angio Chest PE W and/or Wo Contrast Result Date: 10/10/2024 CLINICAL DATA:  Right-sided chest wall pain with pleuritic component 3 days. Possible pulmonary emboli. EXAM: CT ANGIOGRAPHY CHEST WITH CONTRAST TECHNIQUE: Multidetector CT imaging of the chest was performed using the standard protocol during bolus administration of intravenous contrast. Multiplanar CT image reconstructions and MIPs were obtained to evaluate the vascular anatomy. RADIATION DOSE REDUCTION: This exam was performed according to the departmental dose-optimization program which includes automated exposure control, adjustment of the mA and/or kV according to patient size and/or use of iterative reconstruction technique. CONTRAST:  75mL OMNIPAQUE  IOHEXOL  350 MG/ML SOLN COMPARISON:  CT abdomen 06/06/2019 FINDINGS: Cardiovascular: Heart is normal size. Minimal calcified plaque over the lateral circumflex coronary artery. Thoracic aorta is normal in caliber. Pulmonary arterial system is well opacified and demonstrates no evidence of emboli. Remaining vascular structures are unremarkable. Mediastinum/Nodes: No evidence of mediastinal or hilar adenopathy. Remaining mediastinal structures are unremarkable. Lungs/Pleura: Lungs are adequately inflated without acute airspace consolidation or effusion. Airways are normal. Upper Abdomen: No acute findings in the visualized  upper abdomen. Diverticulosis of the colon. Musculoskeletal: No focal abnormality. Review of the MIP images confirms the above findings. IMPRESSION: 1. No acute cardiopulmonary disease and no evidence of pulmonary embolism. 2. Minimal atherosclerotic coronary artery disease. 3. Diverticulosis of the colon. Electronically Signed   By: Toribio Agreste M.D.   On: 10/10/2024 14:50     Procedures   Medications Ordered in the ED  iohexol  (OMNIPAQUE ) 350 MG/ML injection 75 mL (75 mLs Intravenous Contrast Given 10/10/24 1422)    Clinical Course as of 10/10/24 1721  Wed Oct 10, 2024  1719 Comprehensive metabolic panel(!) [AH]  1719 Creatinine(!): 1.06 [AH]  1719 Calcium(!): 8.7 [AH]  1719 I-stat chem 8, ED (not at [facility], [facility] or [facility])(!) [AH]  1719 Troponin I (High Sensitivity) [AH]  1720 EKG shows sinus bradycardia at a rate  of 59.  No other acute findings. [AH]    Clinical Course User Index [AH] Arloa Chroman, PA-C                                 Medical Decision Making Given the large differential diagnosis for Bradleigh Sonnen, the decision making in this case is of high complexity.  After evaluating all of the data points in this case, the presentation of Kathrin Folden is NOT consistent with Acute Coronary Syndrome (ACS) and/or myocardial ischemia, pulmonary embolism, aortic dissection; Borhaave's, significant arrythmia, pneumothorax, cardiac tamponade, or other emergent cardiopulmonary condition.  Further, the presentation of Amandamarie Feggins is NOT consistent with pericarditis, myocarditis, cholecystitis, pancreatitis, mediastinitis, endocarditis, new valvular disease.  Additionally, the presentation of Elona Yinger NOT consistent with flail chest, cardiac contusion, ARDS, or significant intra-thoracic or intra-abdominal bleeding.  Moreover, this presentation is NOT consistent with pneumonia, sepsis, or pyelonephritis.     Strict return and follow-up precautions have been given by  me personally or by detailed written instruction given verbally by nursing staff using the teach back method to the patient/family/caregiver(s).  Data Reviewed/Counseling: I have reviewed the patient's vital signs, nursing notes, and other relevant tests/information. I had a detailed discussion regarding the historical points, exam findings, and any diagnostic results supporting the discharge diagnosis. I also discussed the need for outpatient follow-up and the need to return to the ED if symptoms worsen or if there are any questions or concerns that arise at home.    Amount and/or Complexity of Data Reviewed Labs: ordered. Decision-making details documented in ED Course. Radiology: ordered and independent interpretation performed.    Details: CT angiogram shows minimal atherosclerotic coronary artery disease, no evidence of acute pulmonary embolus. Discussion of management or test interpretation with external provider(s): GYN advised that she should be taking her Eliquis  if she has a history of paroxysmal atrial fibrillation.   .      Final diagnoses:  None    ED Discharge Orders     None          Arloa Chroman, PA-C 10/10/24 1723    Tegeler, Lonni PARAS, MD 10/10/24 [phone]

## 2024-10-10 NOTE — ED Provider Triage Note (Signed)
 Emergency Medicine Provider Triage Evaluation Note  Frances [name] , a 78 y.o. female  was evaluated in triage.  Pt complains of right-sided chest wall pain.  Worse with taking a deep breath.  This started 3 days ago.  She does have history of A-fib but noncompliant with Eliquis  since she has been prescribed this.  Endorses urinary frequency as well.  Pain is located on the right side of her chest that radiates around to the posterior rib cage  Review of Systems  Positive: As above Negative: As above  Physical Exam  BP (!) 155/78 (BP Location: Right Arm)   Pulse 62   Temp 98.3 F (36.8 C) (Oral)   Resp 18   SpO2 98%  Gen:   Awake, no distress   Resp:  Normal effort  MSK:   Moves extremities without difficulty  Other:    Medical Decision Making  Medically screening exam initiated at 1:21 PM.  Appropriate orders placed.  Frances [name] was informed that the remainder of the evaluation will be completed by another provider, this initial triage assessment does not replace that evaluation, and the importance of remaining in the ED until their evaluation is complete.    Frances Loge, PA-C 10/10/24 1322

## 2024-10-11 NOTE — Telephone Encounter (Signed)
 Seen in ED yesterday

## 2024-10-23 ENCOUNTER — Ambulatory Visit: Admitting: Internal Medicine

## 2024-10-23 ENCOUNTER — Encounter: Payer: Self-pay | Admitting: Internal Medicine

## 2024-10-23 VITALS — BP 120/82 | HR 59 | Temp 97.8°F | Ht 63.0 in | Wt 200.2 lb

## 2024-10-23 DIAGNOSIS — I10 Essential (primary) hypertension: Secondary | ICD-10-CM | POA: Diagnosis not present

## 2024-10-23 DIAGNOSIS — L299 Pruritus, unspecified: Secondary | ICD-10-CM | POA: Diagnosis not present

## 2024-10-23 DIAGNOSIS — I679 Cerebrovascular disease, unspecified: Secondary | ICD-10-CM | POA: Diagnosis not present

## 2024-10-23 DIAGNOSIS — E538 Deficiency of other specified B group vitamins: Secondary | ICD-10-CM | POA: Diagnosis not present

## 2024-10-23 DIAGNOSIS — I480 Paroxysmal atrial fibrillation: Secondary | ICD-10-CM

## 2024-10-23 DIAGNOSIS — R42 Dizziness and giddiness: Secondary | ICD-10-CM

## 2024-10-23 DIAGNOSIS — Z23 Encounter for immunization: Secondary | ICD-10-CM

## 2024-10-23 NOTE — Patient Instructions (Addendum)
 Office of patient experience you can contact about the ER [phone].  You can stop the amlodipine  for 1-2 weeks and then let us  know if the itching improves.

## 2024-10-23 NOTE — Progress Notes (Signed)
 "  Subjective:   Patient ID: Frances [name], female    DOB: 23-Dec-1945, 78 y.o.   MRN: [id number]  Discussed the use of AI scribe software for clinical note transcription with the patient, who gave verbal consent to proceed.  History of Present Illness Frances [name] is a 78 year old female with hypertension and atrial fibrillation who presents with concerns about her recent emergency room visit and medication side effects.  She describes a disappointing experience during her recent emergency room visit, where despite undergoing multiple tests, including scans, she was not treated for her pain and was discharged without proper communication. An IV was left in her arm, which her daughters had to remove, causing pain. Additionally, she found EKG stickers still attached to her body after returning home.  She is concerned about potential skin side effects from amlodipine , noting itching and dryness of her hands, which she manages with lotion. She also noticed purple spots on her foot, which she initially thought might be related to amlodipine . She has been on amlodipine  for a long time.  She is taking multiple medications, including Eliquis  for atrial fibrillation, which she started after her emergency room visit. She takes it twice daily. She is also on cholesterol medication. She mentions confusion about her medications, including a cream she was prescribed, and the need to take multiple pills daily. She takes Synthroid  separately from other medications and food.  She reports a history of low B12 levels, which caused severe dizziness, but improved after receiving injections. She notes occasional chest discomfort but states it is not as severe as during her emergency room visit. No current severe chest pain. Her blood pressure was extremely high during the ER visit, possibly due to the tightness of the monitor used.  Review of Systems  Constitutional: Negative.   HENT: Negative.    Eyes: Negative.    Respiratory:  Negative for cough, chest tightness and shortness of breath.   Cardiovascular:  Negative for chest pain, palpitations and leg swelling.  Gastrointestinal:  Negative for abdominal distention, abdominal pain, constipation, diarrhea, nausea and vomiting.  Musculoskeletal: Negative.   Skin: Negative.   Neurological: Negative.   Psychiatric/Behavioral: Negative.      Objective:  Physical Exam Constitutional:      Appearance: She is well-developed.  HENT:     Head: Normocephalic and atraumatic.  Cardiovascular:     Rate and Rhythm: Normal rate and regular rhythm.  Pulmonary:     Effort: Pulmonary effort is normal. No respiratory distress.     Breath sounds: Normal breath sounds. No wheezing or rales.  Abdominal:     General: Bowel sounds are normal. There is no distension.     Palpations: Abdomen is soft.     Tenderness: There is no abdominal tenderness.  Musculoskeletal:     Cervical back: Normal range of motion.  Skin:    General: Skin is warm and dry.  Neurological:     Mental Status: She is alert and oriented to person, place, and time.     Coordination: Coordination normal.     Vitals:   10/23/24 1044  BP: 120/82  Pulse: (!) 59  Temp: 97.8 F (36.6 C)  TempSrc: Oral  SpO2: 99%  Weight: 200 lb 3.2 oz (90.8 kg)  Height: 5' 3 (1.6 m)   Flu shot given at visit Assessment and Plan Assessment & Plan Atrial fibrillation   Managed with Eliquis  to reduce stroke risk. Continue Eliquis  twice daily.  Hypertension   Blood pressure  was extremely high during a recent emergency room visit, but current readings are normal. Amlodipine  may be contributing to pruritus of the hands. Discontinue amlodipine  for 1-2 weeks to assess for improvement in pruritus.  Pruritus of hands, possible medication-related   Pruritus of the hands may be related to amlodipine  use. Discontinue amlodipine  for 1-2 weeks to assess for improvement in pruritus.  Vitamin B12 deficiency    Previously treated with injections, which resolved severe dizziness. Continue B12 supplementation.  Hyperlipidemia   Managed with cholesterol-lowering medication. Continue long-term use to prevent plaque formation in arteries.  Dizziness, resolved   Dizziness resolved after B12 injections.  General Health Maintenance   She is taking multiple medications and has concerns about managing them. Advise taking Synthroid  separately from other medications and food. Continue current medication regimen with attention to Synthroid  administration.   "

## 2024-10-26 DIAGNOSIS — L299 Pruritus, unspecified: Secondary | ICD-10-CM | POA: Insufficient documentation

## 2024-10-26 NOTE — Assessment & Plan Note (Signed)
 Blood pressure was extremely high during a recent emergency room visit, but current readings are normal. Amlodipine  may be contributing to pruritus of the hands. Discontinue amlodipine  for 1-2 weeks to assess for improvement in pruritus.

## 2024-10-26 NOTE — Assessment & Plan Note (Signed)
 Managed with Eliquis  to reduce stroke risk. Continue Eliquis  twice daily.

## 2024-10-26 NOTE — Assessment & Plan Note (Signed)
 Pruritus of the hands may be related to amlodipine  use. Discontinue amlodipine  for 1-2 weeks to assess for improvement in pruritus.

## 2024-10-26 NOTE — Assessment & Plan Note (Signed)
 Continue B12 lifelong.

## 2024-10-26 NOTE — Assessment & Plan Note (Signed)
 Resolved after B12 injections.

## 2024-10-26 NOTE — Assessment & Plan Note (Signed)
 Managed with cholesterol-lowering medication. Continue long-term use to prevent plaque formation in arteries.

## 2024-10-31 NOTE — Telephone Encounter (Signed)
Patient has been seen in the ER 

## 2024-11-22 ENCOUNTER — Other Ambulatory Visit: Payer: Self-pay | Admitting: Internal Medicine

## 2024-11-22 DIAGNOSIS — I10 Essential (primary) hypertension: Secondary | ICD-10-CM

## 2024-12-14 ENCOUNTER — Encounter: Payer: Self-pay | Admitting: *Deleted

## 2024-12-14 NOTE — Progress Notes (Signed)
 Frances [name]                                          MRN: [id number]   12/14/2024   The [facility] Quality Team Specialist reviewed this patient medical record for the purposes of chart review for care gap closure. The following were reviewed: abstraction for care gap closure-controlling blood pressure.    [employer] Quality Team

## 2024-12-21 ENCOUNTER — Telehealth: Payer: Self-pay

## 2024-12-21 NOTE — Telephone Encounter (Signed)
 Copied from CRM [id number]. Topic: General - Other >> Dec 19, 2024  2:20 PM Drema MATSU wrote: Reason for CRM: [facility] wants to very if pt has a chronic conditions that qualify pt for the chronic conditions special needs plan. She sent request on 12/10 and She will be refaxing. She wants documents sent back as soon as possible.

## 2024-12-24 NOTE — Telephone Encounter (Signed)
 Do we have the documents? If so happy to fill out

## 2024-12-24 NOTE — Telephone Encounter (Signed)
 Paperwork has been filled out and faxed back 12/24/24. Confirmation received.

## 2025-01-15 NOTE — Progress Notes (Signed)
 Frances [name]                                          MRN: [id number]   01/15/2025   The [facility] Quality Team Specialist reviewed this patient medical record for the purposes of chart review for care gap closure. The following were reviewed: chart review for care gap closure-controlling blood pressure.    [employer] Quality Team

## 2025-01-25 ENCOUNTER — Telehealth: Payer: Self-pay

## 2025-01-25 NOTE — Telephone Encounter (Signed)
 Copied from CRM #[id number]. Topic: Clinical - Medication Refill >> Jan 25, 2025  3:01 PM Sasha M wrote: Medication: pravastatin  (PRAVACHOL ) 20 MG tablet, Cholecalciferol (VITAMIN D3) 125 MCG (5000 UT) CAPS, apixaban  (ELIQUIS ) 5 MG TABS tablet, cyanocobalamin  (VITAMIN B12) 1000 MCG tablet  Has the patient contacted their pharmacy? No (Agent: If no, request that the patient contact the pharmacy for the refill. If patient does not wish to contact the pharmacy document the reason why and proceed with request.) (Agent: If yes, when and what did the pharmacy advise?)  This is the patient's preferred pharmacy:  [id number] [place], [place] - [address] [place] [address] Phone: [phone] Fax: [phone]  Is this the correct pharmacy for this prescription? Yes If no, delete pharmacy and type the correct one.   Has the prescription been filled recently? No  Is the patient out of the medication? No  Has the patient been seen for an appointment in the last year OR does the patient have an upcoming appointment? Yes  Can we respond through MyChart? No, phone call preferred  Agent: Please be advised that Rx refills may take up to 3 business days. We ask that you follow-up with your pharmacy.
